# Patient Record
Sex: Male | Born: 1945 | Race: Black or African American | Hispanic: No | Marital: Married | Smoking: Former smoker
Health system: Southern US, Community
[De-identification: ages and names within clinical notes are randomized; demographics above are authoritative.]

## PROBLEM LIST (undated history)

## (undated) DIAGNOSIS — R51 Headache: Secondary | ICD-10-CM

## (undated) DIAGNOSIS — N400 Enlarged prostate without lower urinary tract symptoms: Secondary | ICD-10-CM

## (undated) DIAGNOSIS — I517 Cardiomegaly: Secondary | ICD-10-CM

## (undated) DIAGNOSIS — R011 Cardiac murmur, unspecified: Secondary | ICD-10-CM

## (undated) DIAGNOSIS — N183 Chronic kidney disease, stage 3 unspecified: Secondary | ICD-10-CM

## (undated) DIAGNOSIS — R519 Headache, unspecified: Secondary | ICD-10-CM

## (undated) DIAGNOSIS — K759 Inflammatory liver disease, unspecified: Secondary | ICD-10-CM

## (undated) DIAGNOSIS — K219 Gastro-esophageal reflux disease without esophagitis: Secondary | ICD-10-CM

## (undated) DIAGNOSIS — E7800 Pure hypercholesterolemia, unspecified: Secondary | ICD-10-CM

## (undated) DIAGNOSIS — I10 Essential (primary) hypertension: Secondary | ICD-10-CM

## (undated) DIAGNOSIS — I2510 Atherosclerotic heart disease of native coronary artery without angina pectoris: Secondary | ICD-10-CM

## (undated) DIAGNOSIS — M1990 Unspecified osteoarthritis, unspecified site: Secondary | ICD-10-CM

## (undated) HISTORY — PX: VARICOSE VEIN SURGERY: SHX832

## (undated) HISTORY — PX: TONSILLECTOMY: SUR1361

---

## 2012-06-03 HISTORY — PX: CARDIOVASCULAR STRESS TEST: SHX262

## 2014-09-28 NOTE — H&P (Signed)
UNICOMPARTMENTAL KNEE ADMISSION H&P  Patient is being admitted for right medial unicompartmental knee arthroplasty.  Subjective:  Chief Complaint:  Right knee medial compartmental primary OA /pain    HPI: Garrett Hogan, 69 y.o. male male, has a history of pain and functional disability in the right and has failed non-surgical conservative treatments for greater than 12 weeks to include NSAID's and/or analgesics, corticosteriod injections, viscosupplementation injections, use of assistive devices and activity modification.  Onset of symptoms was gradual, starting 2+ years ago with gradually worsening course since that time. The patient noted no past surgery on the right knee(s).  Patient currently rates pain in the right knee(s) at 8 out of 10 with activity. Patient has night pain, worsening of pain with activity and weight bearing, pain that interferes with activities of daily living, pain with passive range of motion, crepitus and joint swelling.  Patient has evidence of periarticular osteophytes and joint space narrowing of the medial compartment by imaging studies.  There is no active infection.  Risks, benefits and expectations were discussed with the patient.  Risks including but not limited to the risk of anesthesia, blood clots, nerve damage, blood vessel damage, failure of the prosthesis, infection and up to and including death.  Patient understand the risks, benefits and expectations and wishes to proceed with surgery.   PCP: Vonna Kotyk  D/C Plans:      Home with HHPT  Post-op Meds:       No Rx given   Tranexamic Acid:      To be given - IV   Decadron:    It is to be given  FYI:     ASA post-op  Norco post-op    Past Medical History  Diagnosis Date  . Hypertension   . Arthritis   . Hepatitis     hx of Hep C - 1970s      Past Surgical History  Procedure Laterality Date  . Varicose vein surgery      left leg     No Known Allergies  History   Substance Use Topics  . Smoking status: Former Games developer  . Smokeless tobacco: Never Used  . Alcohol Use: Yes     Comment: drinks wine once per month        Review of Systems  Constitutional: Negative.   HENT: Negative.   Eyes: Negative.   Respiratory: Negative.   Cardiovascular: Negative.   Gastrointestinal: Negative.   Genitourinary: Positive for frequency.  Musculoskeletal: Positive for joint pain.  Skin: Negative.   Neurological: Negative.   Endo/Heme/Allergies: Negative.   Psychiatric/Behavioral: Negative.      Objective:   Physical Exam  Constitutional: He is oriented to person, place, and time and well-developed, well-nourished, and in no distress.  HENT:  Head: Normocephalic and atraumatic.  Eyes: Pupils are equal, round, and reactive to light.  Neck: Neck supple. No JVD present. No tracheal deviation present. No thyromegaly present.  Cardiovascular: Normal rate, regular rhythm and intact distal pulses.   Murmur heard. Pulmonary/Chest: Effort normal and breath sounds normal. No stridor. No respiratory distress. He has no wheezes.  Abdominal: Soft. There is no tenderness. There is no guarding.  Musculoskeletal:       Right knee: He exhibits decreased range of motion and bony tenderness. He exhibits no effusion, no ecchymosis, no deformity, no laceration and no erythema. Tenderness found. Medial joint line tenderness noted. No lateral joint line tenderness noted.  Lymphadenopathy:    He has no cervical adenopathy.  Neurological: He is alert and oriented to person, place, and time.  Skin: Skin is warm and dry.  Psychiatric: Affect normal.       Imaging Review Plain radiographs demonstrate severe degenerative joint disease of the right knee(s) medial compartment. The overall alignment is neutral. The bone quality appears to be good for age and reported activity level.  Assessment/Plan:  End stage arthritis, right knee medial compartment  The patient  history, physical examination, clinical judgment of the provider and imaging studies are consistent with end stage degenerative joint disease of the right knee(s) and medial unicompartmental knee arthroplasty is deemed medically necessary. The treatment options including medical management, injection therapy arthroscopy and arthroplasty were discussed at length. The risks and benefits of total knee arthroplasty were presented and reviewed. The risks due to aseptic loosening, infection, stiffness, patella tracking problems, thromboembolic complications and other imponderables were discussed. The patient acknowledged the explanation, agreed to proceed with the plan and consent was signed. Patient is being admitted for outpatient / observation treatment for surgery, pain control, PT, OT, prophylactic antibiotics, VTE prophylaxis, progressive ambulation and ADL's and discharge planning. The patient is planning to be discharged home with home health services.    Anastasio AuerbachMatthew S. Kaelon Weekes   PA-C  10/05/2014, 12:44 PM

## 2014-09-29 NOTE — Patient Instructions (Addendum)
Garrett Hogan  09/29/2014   Your procedure is scheduled on: 10-10-2014   Report to [facility] Main  Entrance and follow signs to               Short Stay Center at    [facility] - Preparing for Surgery Before surgery, you can play an important role.  Because skin is not sterile, your skin needs to be as free of germs as possible.  You can reduce the number of germs on your skin by washing with CHG (chlorahexidine gluconate) soap before surgery.  CHG is an antiseptic cleaner which kills germs and bonds with the skin to continue killing germs even after washing. Please DO NOT use if you have an allergy to CHG or antibacterial soaps.  If your skin becomes reddened/irritated stop using the CHG and inform your nurse when you arrive at Short Stay. Do not shave (including legs and underarms) for at least 48 hours prior to the first CHG shower.  You may shave your face/neck. Please follow these instructions carefully:  1.  Shower with CHG Soap the night before surgery and the  morning of Surgery.  2.  If you choose to wash your hair, wash your hair first as usual with your  normal  shampoo.  3.  After you shampoo, rinse your hair and body thoroughly to remove the  shampoo.                           4.  Use CHG as you would any other liquid soap.  You can apply chg directly  to the skin and wash                       Gently with a scrungie or clean washcloth.  5.  Apply the CHG Soap to your body ONLY FROM THE NECK DOWN.   Do not use on face/ open                           Wound or open sores. Avoid contact with eyes, ears mouth and genitals (private parts).                       Wash face,  Genitals (private parts) with your normal soap.             6.  Wash thoroughly, paying special attention to the area where your surgery  will be performed.  7.  Thoroughly rinse your body with warm water from the neck down.  8.  DO NOT shower/wash with your normal soap after using and  rinsing off  the CHG Soap.                9.  Pat yourself dry with a clean towel.            10.  Wear clean pajamas.            11.  Place clean sheets on your bed the night of your first shower and do not  sleep with pets. Day of Surgery : Do not apply any lotions/deodorants the morning of surgery.  Please wear clean clothes to the hospital/surgery center.  FAILURE TO FOLLOW THESE INSTRUCTIONS MAY RESULT IN THE CANCELLATION OF YOUR SURGERY PATIENT SIGNATURE_________________________________  NURSE SIGNATURE__________________________________  ________________________________________________________________________  AM.  Call this number if you have problems the morning of surgery [phone]   Remember:  Do not eat food or drink liquids :After Midnight.     Take these medicines the morning of surgery with A SIP OF WATER:none                                You may not have any metal on your body including hair pins and              piercings  Do not wear jewelry, lotions, powders or perfumes, deodorant                       Men may shave face and neck.   Do not bring valuables to the hospital. [facility] IS NOT             RESPONSIBLE   FOR VALUABLES.  Contacts, dentures or bridgework may not be worn into surgery.  Leave suitcase in the car. After surgery it may be brought to your room.     Special Instructions: coughing and deep breathing exercises, leg exercises              Please read over the following fact sheets you were given: _____________________________________________________________________              WHAT IS A BLOOD TRANSFUSION? Blood Transfusion Information  A transfusion is the replacement of blood or some of its parts. Blood is made up of multiple cells which provide different functions.  Red blood cells carry oxygen and are used for blood loss replacement.  White blood cells fight against infection.  Platelets control bleeding.  Plasma helps  clot blood.  Other blood products are available for specialized needs, such as hemophilia or other clotting disorders. BEFORE THE TRANSFUSION  Who gives blood for transfusions?   Healthy volunteers who are fully evaluated to make sure their blood is safe. This is blood bank blood. Transfusion therapy is the safest it has ever been in the practice of medicine. Before blood is taken from a donor, a complete history is taken to make sure that person has no history of diseases nor engages in risky social behavior (examples are intravenous drug use or sexual activity with multiple partners). The donor's travel history is screened to minimize risk of transmitting infections, such as malaria. The donated blood is tested for signs of infectious diseases, such as HIV and hepatitis. The blood is then tested to be sure it is compatible with you in order to minimize the chance of a transfusion reaction. If you or a relative donates blood, this is often done in anticipation of surgery and is not appropriate for emergency situations. It takes many days to process the donated blood. RISKS AND COMPLICATIONS Although transfusion therapy is very safe and saves many lives, the main dangers of transfusion include:  1. Getting an infectious disease. 2. Developing a transfusion reaction. This is an allergic reaction to something in the blood you were given. Every precaution is taken to prevent this. The decision to have a blood transfusion has been considered carefully by your caregiver before blood is given. Blood is not given unless the benefits outweigh the risks. AFTER THE TRANSFUSION  Right after receiving a blood transfusion, you will usually feel much better and more energetic. This is especially true if your red blood cells have gotten low (  anemic). The transfusion raises the level of the red blood cells which carry oxygen, and this usually causes an energy increase.  The nurse administering the transfusion will  monitor you carefully for complications. HOME CARE INSTRUCTIONS  No special instructions are needed after a transfusion. You may find your energy is better. Speak with your caregiver about any limitations on activity for underlying diseases you may have. SEEK MEDICAL CARE IF:   Your condition is not improving after your transfusion.  You develop redness or irritation at the intravenous (IV) site. SEEK IMMEDIATE MEDICAL CARE IF:  Any of the following symptoms occur over the next 12 hours:  Shaking chills.  You have a temperature by mouth above 102 F (38.9 C), not controlled by medicine.  Chest, back, or muscle pain.  People around you feel you are not acting correctly or are confused.  Shortness of breath or difficulty breathing.  Dizziness and fainting.  You get a rash or develop hives.  You have a decrease in urine output.  Your urine turns a dark color or changes to pink, red, or brown. Any of the following symptoms occur over the next 10 days:  You have a temperature by mouth above 102 F (38.9 C), not controlled by medicine.  Shortness of breath.  Weakness after normal activity.  The white part of the eye turns yellow (jaundice).  You have a decrease in the amount of urine or are urinating less often.  Your urine turns a dark color or changes to pink, red, or brown. Document Released: 05/17/2000 Document Revised: 08/12/2011 Document Reviewed: 01/04/2008 [employer] Patient Information 2014 [place].  _______________________________________________________________________  Incentive Spirometer  An incentive spirometer is a tool that can help keep your lungs clear and active. This tool measures how well you are filling your lungs with each breath. Taking long deep breaths may help reverse or decrease the chance of developing breathing (pulmonary) problems (especially infection) following:  A long period of time when you are unable to move or be  active. BEFORE THE PROCEDURE   If the spirometer includes an indicator to show your best effort, your nurse or respiratory therapist will set it to a desired goal.  If possible, sit up straight or lean slightly forward. Try not to slouch.  Hold the incentive spirometer in an upright position. INSTRUCTIONS FOR USE  3. Sit on the edge of your bed if possible, or sit up as far as you can in bed or on a chair. 4. Hold the incentive spirometer in an upright position. 5. Breathe out normally. 6. Place the mouthpiece in your mouth and seal your lips tightly around it. 7. Breathe in slowly and as deeply as possible, raising the piston or the ball toward the top of the column. 8. Hold your breath for 3-5 seconds or for as long as possible. Allow the piston or ball to fall to the bottom of the column. 9. Remove the mouthpiece from your mouth and breathe out normally. 10. Rest for a few seconds and repeat Steps 1 through 7 at least 10 times every 1-2 hours when you are awake. Take your time and take a few normal breaths between deep breaths. 11. The spirometer may include an indicator to show your best effort. Use the indicator as a goal to work toward during each repetition. 12. After each set of 10 deep breaths, practice coughing to be sure your lungs are clear. If you have an incision (the cut made at the time of  surgery), support your incision when coughing by placing a pillow or rolled up towels firmly against it. Once you are able to get out of bed, walk around indoors and cough well. You may stop using the incentive spirometer when instructed by your caregiver.  RISKS AND COMPLICATIONS  Take your time so you do not get dizzy or light-headed.  If you are in pain, you may need to take or ask for pain medication before doing incentive spirometry. It is harder to take a deep breath if you are having pain. AFTER USE  Rest and breathe slowly and easily.  It can be helpful to keep track of a log of  your progress. Your caregiver can provide you with a simple table to help with this. If you are using the spirometer at home, follow these instructions: SEEK MEDICAL CARE IF:   You are having difficultly using the spirometer.  You have trouble using the spirometer as often as instructed.  Your pain medication is not giving enough relief while using the spirometer.  You develop fever of 100.5 F (38.1 C) or higher. SEEK IMMEDIATE MEDICAL CARE IF:   You cough up bloody sputum that had not been present before.  You develop fever of 102 F (38.9 C) or greater.  You develop worsening pain at or near the incision site. MAKE SURE YOU:   Understand these instructions.  Will watch your condition.  Will get help right away if you are not doing well or get worse. Document Released: 09/30/2006 Document Revised: 08/12/2011 Document Reviewed: 12/01/2006 [facility] Patient Information 2014 [place], [place].   ________________________________________________________________________

## 2014-10-03 ENCOUNTER — Encounter (HOSPITAL_COMMUNITY)
Admission: RE | Admit: 2014-10-03 | Discharge: 2014-10-03 | Disposition: A | Discharge status: Home / Self Care | Payer: Medicare Other | Source: Ambulatory Visit | Attending: Orthopedic Surgery | Admitting: Orthopedic Surgery

## 2014-10-03 ENCOUNTER — Encounter (HOSPITAL_COMMUNITY): Payer: Self-pay

## 2014-10-03 DIAGNOSIS — Z01812 Encounter for preprocedural laboratory examination: Secondary | ICD-10-CM | POA: Diagnosis not present

## 2014-10-03 DIAGNOSIS — M1711 Unilateral primary osteoarthritis, right knee: Secondary | ICD-10-CM | POA: Diagnosis not present

## 2014-10-03 HISTORY — DX: Inflammatory liver disease, unspecified: K75.9

## 2014-10-03 HISTORY — DX: Essential (primary) hypertension: I10

## 2014-10-03 HISTORY — DX: Unspecified osteoarthritis, unspecified site: M19.90

## 2014-10-03 LAB — URINALYSIS, ROUTINE W REFLEX MICROSCOPIC
BILIRUBIN URINE: NEGATIVE
Glucose, UA: NEGATIVE mg/dL
Hgb urine dipstick: NEGATIVE
Ketones, ur: NEGATIVE mg/dL
LEUKOCYTES UA: NEGATIVE
NITRITE: NEGATIVE
Protein, ur: NEGATIVE mg/dL
SPECIFIC GRAVITY, URINE: 1.029 (ref 1.005–1.030)
UROBILINOGEN UA: 0.2 mg/dL (ref 0.0–1.0)
pH: 6 (ref 5.0–8.0)

## 2014-10-03 LAB — COMPREHENSIVE METABOLIC PANEL
ALT: 25 U/L (ref 17–63)
ANION GAP: 7 (ref 5–15)
AST: 40 U/L (ref 15–41)
Albumin: 4.3 g/dL (ref 3.5–5.0)
Alkaline Phosphatase: 38 U/L (ref 38–126)
BUN: 24 mg/dL — ABNORMAL HIGH (ref 6–20)
CALCIUM: 9.2 mg/dL (ref 8.9–10.3)
CHLORIDE: 108 mmol/L (ref 101–111)
CO2: 28 mmol/L (ref 22–32)
CREATININE: 1.33 mg/dL — AB (ref 0.61–1.24)
GFR calc non Af Amer: 53 mL/min — ABNORMAL LOW (ref >60)
Glucose, Bld: 91 mg/dL (ref 70–99)
Potassium: 4.2 mmol/L (ref 3.5–5.1)
Sodium: 143 mmol/L (ref 135–145)
Total Bilirubin: 0.8 mg/dL (ref 0.3–1.2)
Total Protein: 7.3 g/dL (ref 6.5–8.1)

## 2014-10-03 LAB — CBC
HCT: 42.6 % (ref 39.0–52.0)
Hemoglobin: 13.6 g/dL (ref 13.0–17.0)
MCH: 31.7 pg (ref 26.0–34.0)
MCHC: 31.9 g/dL (ref 30.0–36.0)
MCV: 99.3 fL (ref 78.0–100.0)
Platelets: 225 10*3/uL (ref 150–400)
RBC: 4.29 MIL/uL (ref 4.22–5.81)
RDW: 14.5 % (ref 11.5–15.5)
WBC: 5.9 10*3/uL (ref 4.0–10.5)

## 2014-10-03 LAB — SURGICAL PCR SCREEN
MRSA, PCR: NEGATIVE
Staphylococcus aureus: POSITIVE — AB

## 2014-10-03 LAB — PROTIME-INR
INR: 1.31 (ref 0.00–1.49)
Prothrombin Time: 16.4 seconds — ABNORMAL HIGH (ref 11.6–15.2)

## 2014-10-03 LAB — APTT: aPTT: 44 seconds — ABNORMAL HIGH (ref 24–37)

## 2014-10-03 LAB — ABO/RH: ABO/RH(D): O POS

## 2014-10-03 NOTE — Progress Notes (Signed)
PTT results done 10/03/2014 faxed via EPIC to Dr Charlann Boxerlin.

## 2014-10-03 NOTE — Progress Notes (Addendum)
Reqeuested EKG with date of birth and date of service for the third time along with ECHO done 09/2014.   LOV note- Dr Hanley Haysosario 09/02/2014 along with clearance 09/02/2014 on chart  Stress Test done 2014 on chart \\EKG  done 09/02/2014 on chart along with ECHO done 09/02/2014 on chart

## 2014-10-03 NOTE — Progress Notes (Signed)
CMP results done 10/03/14 faxed via EPIC to Dr Charlann Boxerlin.

## 2014-10-10 ENCOUNTER — Ambulatory Visit (HOSPITAL_COMMUNITY): Payer: Medicare Other | Admitting: Registered Nurse

## 2014-10-10 ENCOUNTER — Observation Stay (HOSPITAL_COMMUNITY)
Admission: RE | Admit: 2014-10-10 | Discharge: 2014-10-11 | Disposition: A | Discharge status: Home / Self Care | Payer: Medicare Other | Source: Ambulatory Visit | Attending: Orthopedic Surgery | Admitting: Orthopedic Surgery

## 2014-10-10 ENCOUNTER — Encounter (HOSPITAL_COMMUNITY)
Admission: RE | Disposition: A | Discharge status: Home / Self Care | Payer: Self-pay | Source: Ambulatory Visit | Attending: Orthopedic Surgery

## 2014-10-10 ENCOUNTER — Encounter (HOSPITAL_COMMUNITY): Payer: Self-pay | Admitting: *Deleted

## 2014-10-10 DIAGNOSIS — M1711 Unilateral primary osteoarthritis, right knee: Secondary | ICD-10-CM | POA: Diagnosis present

## 2014-10-10 DIAGNOSIS — B1920 Unspecified viral hepatitis C without hepatic coma: Secondary | ICD-10-CM | POA: Insufficient documentation

## 2014-10-10 DIAGNOSIS — Z96651 Presence of right artificial knee joint: Secondary | ICD-10-CM

## 2014-10-10 DIAGNOSIS — I10 Essential (primary) hypertension: Secondary | ICD-10-CM | POA: Insufficient documentation

## 2014-10-10 DIAGNOSIS — Z96659 Presence of unspecified artificial knee joint: Secondary | ICD-10-CM

## 2014-10-10 DIAGNOSIS — Z87891 Personal history of nicotine dependence: Secondary | ICD-10-CM | POA: Diagnosis not present

## 2014-10-10 HISTORY — PX: PARTIAL KNEE ARTHROPLASTY: SHX2174

## 2014-10-10 LAB — TYPE AND SCREEN
ABO/RH(D): O POS
Antibody Screen: NEGATIVE

## 2014-10-10 SURGERY — ARTHROPLASTY, KNEE, UNICOMPARTMENTAL
Anesthesia: Spinal | Site: Knee | Laterality: Right

## 2014-10-10 MED ORDER — OXYCODONE HCL 5 MG/5ML PO SOLN
5.0000 mg | Freq: Once | ORAL | Status: DC | PRN
Start: 2014-10-10 — End: 2014-10-10

## 2014-10-10 MED ORDER — CHLORHEXIDINE GLUCONATE 4 % EX LIQD: 60.0000 mL | Freq: Once | CUTANEOUS | Status: DC

## 2014-10-10 MED ORDER — CEFAZOLIN SODIUM-DEXTROSE 2-3 GM-% IV SOLR
INTRAVENOUS | Status: AC
  Filled 2014-10-10: qty 50

## 2014-10-10 MED ORDER — PROPOFOL 10 MG/ML IV BOLUS
INTRAVENOUS | Status: AC
  Filled 2014-10-10: qty 20

## 2014-10-10 MED ORDER — KETOROLAC TROMETHAMINE 30 MG/ML IJ SOLN
INTRAMUSCULAR | Status: DC | PRN
  Administered 2014-10-10: 30 mg

## 2014-10-10 MED ORDER — ONDANSETRON HCL 4 MG PO TABS: 4.0000 mg | ORAL_TABLET | Freq: Four times a day (QID) | ORAL | Status: DC | PRN

## 2014-10-10 MED ORDER — PHENOL 1.4 % MT LIQD: 1.0000 | OROMUCOSAL | Status: DC | PRN

## 2014-10-10 MED ORDER — FENTANYL CITRATE (PF) 100 MCG/2ML IJ SOLN
INTRAMUSCULAR | Status: DC | PRN
  Administered 2014-10-10: 100 ug via INTRAVENOUS

## 2014-10-10 MED ORDER — MENTHOL 3 MG MT LOZG: 1.0000 | LOZENGE | OROMUCOSAL | Status: DC | PRN

## 2014-10-10 MED ORDER — SODIUM CHLORIDE 0.9 % IJ SOLN
INTRAMUSCULAR | Status: DC | PRN
  Administered 2014-10-10: 29 mL

## 2014-10-10 MED ORDER — SODIUM CHLORIDE 0.9 % IV SOLN
INTRAVENOUS | Status: DC
Start: 2014-10-10 — End: 2014-10-11
  Administered 2014-10-10: 11:00:00 via INTRAVENOUS
  Filled 2014-10-10 (×9): qty 1000

## 2014-10-10 MED ORDER — PROPOFOL 10 MG/ML IV BOLUS
INTRAVENOUS | Status: DC | PRN
  Administered 2014-10-10: 30 mg via INTRAVENOUS

## 2014-10-10 MED ORDER — SODIUM CHLORIDE 0.9 % IJ SOLN
INTRAMUSCULAR | Status: AC
  Filled 2014-10-10: qty 50

## 2014-10-10 MED ORDER — DILTIAZEM HCL ER 240 MG PO CP24
240.0000 mg | ORAL_CAPSULE | Freq: Every day | ORAL | Status: DC
  Administered 2014-10-10: 240 mg via ORAL
  Filled 2014-10-10 (×2): qty 1

## 2014-10-10 MED ORDER — KETOROLAC TROMETHAMINE 30 MG/ML IJ SOLN
30.0000 mg | Freq: Once | INTRAMUSCULAR | Status: AC | PRN
  Administered 2014-10-10: 30 mg via INTRAVENOUS

## 2014-10-10 MED ORDER — BUPIVACAINE-EPINEPHRINE (PF) 0.25% -1:200000 IJ SOLN
INTRAMUSCULAR | Status: AC
  Filled 2014-10-10: qty 30

## 2014-10-10 MED ORDER — LACTATED RINGERS IV SOLN
INTRAVENOUS | Status: DC | PRN
  Administered 2014-10-10 (×3): via INTRAVENOUS

## 2014-10-10 MED ORDER — METHOCARBAMOL 500 MG PO TABS
500.0000 mg | ORAL_TABLET | Freq: Four times a day (QID) | ORAL | Status: DC | PRN
  Administered 2014-10-10 – 2014-10-11 (×2): 500 mg via ORAL
  Filled 2014-10-10 (×2): qty 1

## 2014-10-10 MED ORDER — CELECOXIB 200 MG PO CAPS
200.0000 mg | ORAL_CAPSULE | Freq: Two times a day (BID) | ORAL | Status: DC
  Administered 2014-10-10: 200 mg via ORAL
  Filled 2014-10-10 (×3): qty 1

## 2014-10-10 MED ORDER — ONDANSETRON HCL 4 MG/2ML IJ SOLN: 4.0000 mg | Freq: Four times a day (QID) | INTRAMUSCULAR | Status: DC | PRN

## 2014-10-10 MED ORDER — KETOROLAC TROMETHAMINE 30 MG/ML IJ SOLN
INTRAMUSCULAR | Status: AC
  Filled 2014-10-10: qty 1

## 2014-10-10 MED ORDER — DOCUSATE SODIUM 100 MG PO CAPS
100.0000 mg | ORAL_CAPSULE | Freq: Two times a day (BID) | ORAL | Status: DC
Start: 2014-10-10 — End: 2014-10-11
  Administered 2014-10-10 – 2014-10-11 (×2): 100 mg via ORAL

## 2014-10-10 MED ORDER — HYDROMORPHONE HCL 1 MG/ML IJ SOLN
INTRAMUSCULAR | Status: AC
  Filled 2014-10-10: qty 2

## 2014-10-10 MED ORDER — BISACODYL 10 MG RE SUPP: 10.0000 mg | Freq: Every day | RECTAL | Status: DC | PRN

## 2014-10-10 MED ORDER — PROPOFOL INFUSION 10 MG/ML OPTIME
INTRAVENOUS | Status: DC | PRN
  Administered 2014-10-10: 50 ug/kg/min via INTRAVENOUS

## 2014-10-10 MED ORDER — HYDROCODONE-ACETAMINOPHEN 7.5-325 MG PO TABS
1.0000 | ORAL_TABLET | ORAL | Status: DC
  Administered 2014-10-10: 2 via ORAL
  Administered 2014-10-10 – 2014-10-11 (×3): 1 via ORAL
  Filled 2014-10-10: qty 1
  Filled 2014-10-10: qty 2
  Filled 2014-10-10 (×2): qty 1
  Filled 2014-10-10: qty 2

## 2014-10-10 MED ORDER — BUPIVACAINE-EPINEPHRINE 0.25% -1:200000 IJ SOLN
INTRAMUSCULAR | Status: DC | PRN
  Administered 2014-10-10: 30 mL

## 2014-10-10 MED ORDER — METOCLOPRAMIDE HCL 5 MG PO TABS
5.0000 mg | ORAL_TABLET | Freq: Three times a day (TID) | ORAL | Status: DC | PRN
  Filled 2014-10-10: qty 2

## 2014-10-10 MED ORDER — HYDROMORPHONE HCL 1 MG/ML IJ SOLN
INTRAMUSCULAR | Status: AC
  Filled 2014-10-10: qty 1

## 2014-10-10 MED ORDER — ASPIRIN EC 325 MG PO TBEC
325.0000 mg | DELAYED_RELEASE_TABLET | Freq: Two times a day (BID) | ORAL | Status: DC
  Administered 2014-10-11: 325 mg via ORAL
  Filled 2014-10-10 (×3): qty 1

## 2014-10-10 MED ORDER — DEXTROSE 5 % IV SOLN
500.0000 mg | Freq: Four times a day (QID) | INTRAVENOUS | Status: DC | PRN
  Administered 2014-10-10: 500 mg via INTRAVENOUS
  Filled 2014-10-10 (×2): qty 5

## 2014-10-10 MED ORDER — FENTANYL CITRATE (PF) 100 MCG/2ML IJ SOLN
INTRAMUSCULAR | Status: AC
  Filled 2014-10-10: qty 2

## 2014-10-10 MED ORDER — METOCLOPRAMIDE HCL 5 MG/ML IJ SOLN: 5.0000 mg | Freq: Three times a day (TID) | INTRAMUSCULAR | Status: DC | PRN

## 2014-10-10 MED ORDER — HYDROMORPHONE HCL 1 MG/ML IJ SOLN
0.2500 mg | INTRAMUSCULAR | Status: DC | PRN
  Administered 2014-10-10 (×4): 0.5 mg via INTRAVENOUS

## 2014-10-10 MED ORDER — HYDROMORPHONE HCL 1 MG/ML IJ SOLN
INTRAMUSCULAR | Status: AC
  Administered 2014-10-10: 1 mg via INTRAVENOUS
  Filled 2014-10-10: qty 1

## 2014-10-10 MED ORDER — KETOROLAC TROMETHAMINE 30 MG/ML IJ SOLN
INTRAMUSCULAR | Status: AC
Start: 2014-10-10 — End: 2014-10-10
  Filled 2014-10-10: qty 1

## 2014-10-10 MED ORDER — HYDROMORPHONE HCL 1 MG/ML IJ SOLN
0.5000 mg | INTRAMUSCULAR | Status: DC | PRN
  Administered 2014-10-10: 1 mg via INTRAVENOUS
  Filled 2014-10-10: qty 1

## 2014-10-10 MED ORDER — MEPERIDINE HCL 50 MG/ML IJ SOLN: 6.2500 mg | INTRAMUSCULAR | Status: DC | PRN

## 2014-10-10 MED ORDER — MAGNESIUM CITRATE PO SOLN: 1.0000 | Freq: Once | ORAL | Status: AC | PRN

## 2014-10-10 MED ORDER — FERROUS SULFATE 325 (65 FE) MG PO TABS
325.0000 mg | ORAL_TABLET | Freq: Three times a day (TID) | ORAL | Status: DC
  Filled 2014-10-10 (×6): qty 1

## 2014-10-10 MED ORDER — 0.9 % SODIUM CHLORIDE (POUR BTL) OPTIME
TOPICAL | Status: DC | PRN
  Administered 2014-10-10: 1000 mL

## 2014-10-10 MED ORDER — DIPHENHYDRAMINE HCL 25 MG PO CAPS: 25.0000 mg | ORAL_CAPSULE | Freq: Four times a day (QID) | ORAL | Status: DC | PRN

## 2014-10-10 MED ORDER — OXYCODONE HCL 5 MG PO TABS: 5.0000 mg | ORAL_TABLET | Freq: Once | ORAL | Status: DC | PRN

## 2014-10-10 MED ORDER — MIDAZOLAM HCL 2 MG/2ML IJ SOLN
INTRAMUSCULAR | Status: AC
  Filled 2014-10-10: qty 2

## 2014-10-10 MED ORDER — PROMETHAZINE HCL 25 MG/ML IJ SOLN
INTRAMUSCULAR | Status: AC
Start: 2014-10-10 — End: 2014-10-10
  Filled 2014-10-10: qty 1

## 2014-10-10 MED ORDER — POLYETHYLENE GLYCOL 3350 17 G PO PACK
17.0000 g | PACK | Freq: Two times a day (BID) | ORAL | Status: DC
  Administered 2014-10-11: 17 g via ORAL

## 2014-10-10 MED ORDER — CEFAZOLIN SODIUM-DEXTROSE 2-3 GM-% IV SOLR
2.0000 g | Freq: Four times a day (QID) | INTRAVENOUS | Status: AC
  Administered 2014-10-10 (×2): 2 g via INTRAVENOUS
  Filled 2014-10-10 (×2): qty 50

## 2014-10-10 MED ORDER — CEFAZOLIN SODIUM-DEXTROSE 2-3 GM-% IV SOLR
2.0000 g | INTRAVENOUS | Status: AC
  Administered 2014-10-10: 2 g via INTRAVENOUS

## 2014-10-10 MED ORDER — SODIUM CHLORIDE 0.9 % IV SOLN
1000.0000 mg | Freq: Once | INTRAVENOUS | Status: AC
  Administered 2014-10-10: 1000 mg via INTRAVENOUS
  Filled 2014-10-10: qty 10

## 2014-10-10 MED ORDER — DEXAMETHASONE SODIUM PHOSPHATE 10 MG/ML IJ SOLN
10.0000 mg | Freq: Once | INTRAMUSCULAR | Status: AC
  Administered 2014-10-10: 10 mg via INTRAVENOUS

## 2014-10-10 MED ORDER — MIDAZOLAM HCL 5 MG/5ML IJ SOLN
INTRAMUSCULAR | Status: DC | PRN
  Administered 2014-10-10: 2 mg via INTRAVENOUS

## 2014-10-10 MED ORDER — PROMETHAZINE HCL 25 MG/ML IJ SOLN
6.2500 mg | INTRAMUSCULAR | Status: DC | PRN
  Administered 2014-10-10: 12.5 mg via INTRAVENOUS

## 2014-10-10 MED ORDER — ALUM & MAG HYDROXIDE-SIMETH 200-200-20 MG/5ML PO SUSP: 30.0000 mL | ORAL | Status: DC | PRN

## 2014-10-10 MED ORDER — DEXAMETHASONE SODIUM PHOSPHATE 10 MG/ML IJ SOLN
10.0000 mg | Freq: Once | INTRAMUSCULAR | Status: AC
  Administered 2014-10-11: 10 mg via INTRAVENOUS
  Filled 2014-10-10: qty 1

## 2014-10-10 SURGICAL SUPPLY — 53 items
BAG ZIPLOCK 12X15 (MISCELLANEOUS) IMPLANT
BANDAGE ELASTIC 6 VELCRO ST LF (GAUZE/BANDAGES/DRESSINGS) ×2 IMPLANT
BANDAGE ESMARK 6X9 LF (GAUZE/BANDAGES/DRESSINGS) ×1 IMPLANT
BLADE SAW RECIPROCATING 77.5 (BLADE) ×2 IMPLANT
BLADE SAW SGTL 13.0X1.19X90.0M (BLADE) ×2 IMPLANT
BNDG ESMARK 6X9 LF (GAUZE/BANDAGES/DRESSINGS) ×2
BOWL SMART MIX CTS (DISPOSABLE) ×2 IMPLANT
CAPT KNEE PARTIAL 2 ×2 IMPLANT
CEMENT HV SMART SET (Cement) ×2 IMPLANT
CUFF TOURN SGL QUICK 34 (TOURNIQUET CUFF) ×1
CUFF TRNQT CYL 34X4X40X1 (TOURNIQUET CUFF) ×1 IMPLANT
DRAPE EXTREMITY T 121X128X90 (DRAPE) ×2 IMPLANT
DRAPE POUCH INSTRU U-SHP 10X18 (DRAPES) ×2 IMPLANT
DRAPE U-SHAPE 47X51 STRL (DRAPES) ×2 IMPLANT
DRSG AQUACEL AG ADV 3.5X10 (GAUZE/BANDAGES/DRESSINGS) ×2 IMPLANT
DURAPREP 26ML APPLICATOR (WOUND CARE) ×4 IMPLANT
ELECT REM PT RETURN 9FT ADLT (ELECTROSURGICAL) ×2
ELECTRODE REM PT RTRN 9FT ADLT (ELECTROSURGICAL) ×1 IMPLANT
FACESHIELD WRAPAROUND (MASK) ×8 IMPLANT
GLOVE BIOGEL PI IND STRL 6.5 (GLOVE) ×2 IMPLANT
GLOVE BIOGEL PI IND STRL 7.5 (GLOVE) ×2 IMPLANT
GLOVE BIOGEL PI IND STRL 8.5 (GLOVE) ×1 IMPLANT
GLOVE BIOGEL PI INDICATOR 6.5 (GLOVE) ×2
GLOVE BIOGEL PI INDICATOR 7.5 (GLOVE) ×2
GLOVE BIOGEL PI INDICATOR 8.5 (GLOVE) ×1
GLOVE ECLIPSE 8.0 STRL XLNG CF (GLOVE) ×4 IMPLANT
GLOVE ORTHO TXT STRL SZ7.5 (GLOVE) ×4 IMPLANT
GLOVE SURG SS PI 6.5 STRL IVOR (GLOVE) ×2 IMPLANT
GLOVE SURG SS PI 7.5 STRL IVOR (GLOVE) ×2 IMPLANT
GOWN BRE IMP PREV XXLGXLNG (GOWN DISPOSABLE) ×2 IMPLANT
GOWN SPEC L3 XXLG W/TWL (GOWN DISPOSABLE) ×2 IMPLANT
GOWN STRL REUS W/TWL LRG LVL3 (GOWN DISPOSABLE) ×2 IMPLANT
GOWN STRL REUS W/TWL XL LVL3 (GOWN DISPOSABLE) ×2 IMPLANT
KIT BASIN OR (CUSTOM PROCEDURE TRAY) ×2 IMPLANT
LEGGING LITHOTOMY PAIR STRL (DRAPES) ×2 IMPLANT
LIQUID BAND (GAUZE/BANDAGES/DRESSINGS) ×2 IMPLANT
MANIFOLD NEPTUNE II (INSTRUMENTS) ×2 IMPLANT
NDL SAFETY ECLIPSE 18X1.5 (NEEDLE) ×1 IMPLANT
NEEDLE HYPO 18GX1.5 SHARP (NEEDLE) ×1
PACK TOTAL JOINT (CUSTOM PROCEDURE TRAY) ×2 IMPLANT
PEN SKIN MARKING BROAD (MISCELLANEOUS) ×2 IMPLANT
SUCTION FRAZIER 12FR DISP (SUCTIONS) ×2 IMPLANT
SUT MNCRL AB 4-0 PS2 18 (SUTURE) ×2 IMPLANT
SUT VIC AB 1 CT1 36 (SUTURE) ×2 IMPLANT
SUT VIC AB 2-0 CT1 27 (SUTURE) ×2
SUT VIC AB 2-0 CT1 TAPERPNT 27 (SUTURE) ×2 IMPLANT
SUT VLOC 180 0 24IN GS25 (SUTURE) ×2 IMPLANT
SYR 50ML LL SCALE MARK (SYRINGE) ×2 IMPLANT
TOWEL OR 17X26 10 PK STRL BLUE (TOWEL DISPOSABLE) ×2 IMPLANT
TOWEL OR NON WOVEN STRL DISP B (DISPOSABLE) IMPLANT
TRAY FOLEY W/METER SILVER 16FR (SET/KITS/TRAYS/PACK) ×2 IMPLANT
WRAP KNEE MAXI GEL POST OP (GAUZE/BANDAGES/DRESSINGS) ×2 IMPLANT
YANKAUER SUCT BULB TIP NO VENT (SUCTIONS) ×2 IMPLANT

## 2014-10-10 NOTE — Evaluation (Signed)
Physical Therapy Evaluation Patient Details Name: Garrett Hogan MRN: [id number] DOB: [birth year] Today's Date: 10/10/2014   History of Present Illness  R UKA  Clinical Impression  Patient  Tolerated well.  Patient will benefit from PT to address problems listed in note below.    Follow Up Recommendations Home health PT;Supervision/Assistance - 24 hour    Equipment Recommendations  Crutches    Recommendations for Other Services       Precautions / Restrictions Precautions Precautions: Fall;Knee      Mobility  Bed Mobility Overal bed mobility: Needs Assistance Bed Mobility: Supine to Sit     Supine to sit: Min assist     General bed mobility comments: support R knee  Transfers Overall transfer level: Needs assistance Equipment used: Rolling walker (2 wheeled) Transfers: Sit to/from Stand Sit to Stand: Min assist         General transfer comment: cues for safety  Ambulation/Gait Ambulation/Gait assistance: Min assist Ambulation Distance (Feet): 100 Feet Assistive device: Rolling walker (2 wheeled) Gait Pattern/deviations: Step-to pattern;Step-through pattern;Antalgic     General Gait Details: cues for sequence  Stairs            Wheelchair Mobility    Modified Rankin (Stroke Patients Only)       Balance                                             Pertinent Vitals/Pain Pain Assessment: 0-10 Pain Score: 5  Pain Descriptors / Indicators: Aching Pain Intervention(s): Limited activity within patient's tolerance;Monitored during session;Premedicated before session;Ice applied    Home Living Family/patient expects to be discharged to:: Private residence Living Arrangements: Spouse/significant other Available Help at Discharge: Family Type of Home: House Home Access: Stairs to enter Entrance Stairs-Rails: None Entrance Stairs-Number of Steps: 3 Home Layout: Two level Home Equipment: Environmental consultantWalker - 2 wheels      Prior  Function Level of Independence: Independent               Hand Dominance        Extremity/Trunk Assessment               Lower Extremity Assessment: RLE deficits/detail RLE Deficits / Details: able to SLR       Communication   Communication: No difficulties  Cognition Arousal/Alertness: Awake/alert Behavior During Therapy: WFL for tasks assessed/performed Overall Cognitive Status: Within Functional Limits for tasks assessed                      General Comments      Exercises        Assessment/Plan    PT Assessment Patient needs continued PT services  PT Diagnosis Difficulty walking;Acute pain   PT Problem List Decreased strength;Decreased range of motion;Decreased activity tolerance;Decreased knowledge of precautions;Decreased safety awareness;Decreased knowledge of use of DME  PT Treatment Interventions DME instruction;Gait training;Stair training;Functional mobility training;Therapeutic activities;Therapeutic exercise;Patient/family education   PT Goals (Current goals can be found in the Care Plan section) Acute Rehab PT Goals Patient Stated Goal: to go homw PT Goal Formulation: With patient/family Time For Goal Achievement: 10/14/14 Potential to Achieve Goals: Good    Frequency 7X/week   Barriers to discharge        Co-evaluation               End of Session   Activity  Tolerance: Patient tolerated treatment well Patient left: in chair;with family/visitor present Nurse Communication: Mobility status    Functional Assessment Tool Used: clinical judgement Functional Limitation: Mobility: Walking and moving around Mobility: Walking and Moving Around Current Status ([id number]): At least 20 percent but less than 40 percent impaired, limited or restricted Mobility: Walking and Moving Around Goal Status [phone](G8979): At least 1 percent but less than 20 percent impaired, limited or restricted    Time: [id number] PT Time Calculation (min)  (ACUTE ONLY): 21 min   Charges:   PT Evaluation $Initial PT Evaluation Tier I: 1 Procedure     PT G Codes:   PT G-Codes **NOT FOR INPATIENT CLASS** Functional Assessment Tool Used: clinical judgement Functional Limitation: Mobility: Walking and moving around Mobility: Walking and Moving Around Current Status (N8295(G8978): At least 20 percent but less than 40 percent impaired, limited or restricted Mobility: Walking and Moving Around Goal Status [phone](G8979): At least 1 percent but less than 20 percent impaired, limited or restricted    Rada HayHill, Garrett Hogan 10/10/2014, 5:13 PM Blanchard KelchKaren Elianna Hogan PT [phone]

## 2014-10-10 NOTE — Anesthesia Procedure Notes (Signed)
Spinal Patient location during procedure: OR Staffing Performed by: anesthesiologist  Preanesthetic Checklist Completed: patient identified, site marked, surgical consent, pre-op evaluation, timeout performed, IV checked, risks and benefits discussed and monitors and equipment checked Spinal Block Patient position: sitting Prep: Betadine Patient monitoring: heart rate, continuous pulse ox and blood pressure Location: L3-4 Injection technique: single-shot Needle Needle type: Spinocan  Needle gauge: 22 G Needle length: 9 cm Assessment Sensory level: T6 Additional Notes Expiration date of kit checked and confirmed. Patient tolerated procedure well, without complications.  CSF x3 negative heme or paresthesia

## 2014-10-10 NOTE — Op Note (Signed)
NAME: Garrett Hogan    MEDICAL RECORD NO.: [id number]   [facility]ACILITY[facility]   DATE OF BIRTH: [birth year]  PHYSICIAN: Madlyn FrankelMatthew D. Charlann Boxerlin, M.D.    DATE OF PROCEDURE: 10/10/2014    OPERATIVE REPORT   PREOPERATIVE DIAGNOSIS: Right knee medial compartment osteoarthritis.   POSTOPERATIVE DIAGNOSIS: Right knee medial compartment osteoarthritis.  PROCEDURE: Right partial knee replacement utilizing Biomet Oxford knee  component, size Medium femur, a right medial size C tibial tray with a size 4 mm insert.   SURGEON: Madlyn FrankelMatthew D. Charlann Boxerlin, M.D.   ASSISTANT: Lanney GinsMatthew Babish, PAC.  Please note that Mr. Carmon SailsBabish was present for the entirety of the case,  utilized for preoperative positioning, perioperative retractor  management, general facilitation of the case and primary wound closure.   ANESTHESIA: Spinal.   SPECIMENS: None.   COMPLICATIONS: None.  DRAINS: None   TOURNIQUET TIME: 37 minutes at 250 mmHg.   INDICATIONS FOR PROCEDURE: The patient is a 69 y.o. patient of mine who presented for evaluation of right knee pain.  They presented with primary complaints of pain on the medial side of their knee. Radiographs revealed advanced medial compartment arthritis with specifically an antero-medial wear pattern.  There was bone on bone changes noted with subchondral sclerosis and osteophytes present. The patient has had progressive problems failing to respond to conservative measures of medications, injections and activity modification. Risks of infection, DVT, component failure, need for future revision surgery were all discussed and reviewed.  Consent was obtained for benefit of pain relief.   PROCEDURE IN DETAIL: The patient was brought to the operative theater.  Once adequate anesthesia, preoperative antibiotics, 2gm of Ancef, 1gm of Tranexamic Acid, and 10mg  of Decadron administered, the patient was positioned in supine position with a right thigh tourniquet  placed. The right lower extremity was  prepped and draped in sterile  fashion with the leg on the Oxford leg holder.  The leg was allowed to flex to 120 degrees. A time-out  was performed identifying the patient, planned procedure, and extremity.  The leg was exsanguinated, tourniquet elevated to 250 mmHg. A midline  incision was made from the proximal pole of the patella to the tibial tubercle. A  soft tissue plane was created and partial median arthrotomy was then  made to allow for subluxation of the patella. Following initial synovectomy and  debridement, the osteophytes were removed off the medial aspect of the  knee.   Attention was first directed to the tibia. The tibial  extramedullary guide was positioned over the anterior crest of the tibia  and pinned into position, and using a measured resection guide from the  Oxford system, a 4 mm resection was made off the proximal tibia. First  the reciprocating saw along the medial aspect of the tibial spines, then the oscillating saw.    At this point, I sized this cut surface seem to be best fit for a size C tibial tray.  With the retractors out of the wound and the knee held at 90 degrees the 4 feeler gauge had appropriate tension on the medial ligament.   At this point, the femoral canal was opened with a drill and the  intramedullary rod passed. Then using the guide for the medium posterior resection off  the posterior aspect of the femur was positioned over the mid portion of the medial femoral condyle.  The orientation was set using the guide that mates the femoral guide to the intramedullary rod.  The 2 drill holes were made  into the distal femur.  The posterior guide was then impacted into place and the posterior  femoral cut made.  At this point, I milled the distal femur with a size 4 spigot in place. At this point, we did a trial reduction of the Medium femur, size C tibial tray and a 4 feeler gauge. At 90 degrees of  flexion and at 20 degrees of flexion the knee had  symmetric tension on  the ligaments.   Given these findings, the trial femoral component was removed. Final preparation of tibia was carried out by pinning it in position. Then  using a reciprocating saw I removed bone for the keel. Further bone was  removed with an osteotome.  Trial reduction was now carried out with the Medium femur, the keeled C tibia, and the size 4 lollipop insert. The balance of the  ligaments appeared to be symmetric at 20 degrees and 90 degrees. Given  all these findings, the trial components were removed.   Cement was mixed. The final components were opened. The knee was irrigated with  normal saline solution. Then final debridements of the  soft tissue was carried out, I also drilled the sclerotic bone with a drill.  The final components were cemented with a single batch of cement in a  two-stage technique with the tibial component cemented first. The knee  was then brought  to 45 degrees of flexion with a 4 feeler gauge, held with pressure for a minute and half.  After this the femoral component was cemented in place.  The knee was again held at 45 degrees of flexion while the cement fully cured.  Excess cement was removed throughout the knee. Tourniquet was let down  after 37 minutes. After the cement had fully cured and excessive cement  was removed throughout the knee there was no visualized cement present.   The final size 4 Right medial insert was chosen and snapped into position. We re-irrigated  the knee. The extensor mechanism  was then reapproximated using a #1 Vicryl and #0 V-lock sutures with the knee in flexion. The  remaining wound was closed with 2-0 Vicryl and a running 4-0 Monocryl.  The knee was cleaned, dried, and dressed sterilely using Dermabond and  Aquacel dressing. The patient  was brought to the recovery room, Ace wrap in place, tolerating the  procedure well. He will be in the hospital for overnight observation.  We will initiate  physical therapy and progress to ambulate.     Madlyn FrankelMatthew D. Charlann Boxerlin, M.D.

## 2014-10-10 NOTE — Anesthesia Postprocedure Evaluation (Signed)
Anesthesia Post Note  Patient: Garrett Hogan  Procedure(s) Performed: Procedure(s) (LRB): RIGHT UNI-KNEE ARTHROPLASTY MEDIALLY (Right)  Anesthesia type: Spinal  Patient location: PACU  Post pain: Pain level controlled  Post assessment: Post-op Vital signs reviewed  Last Vitals: BP 169/72 mmHg  Pulse 72  Temp(Src) 36.9 C (Oral)  Resp 16  Ht 6\' 3"  (1.905 m)  Wt 243 lb (110.224 kg)  BMI 30.37 kg/m2  SpO2 100%  Post vital signs: Reviewed  Level of consciousness: sedated  Complications: No apparent anesthesia complications

## 2014-10-10 NOTE — Anesthesia Preprocedure Evaluation (Signed)
Anesthesia Evaluation  Patient identified by MRN, date of birth, ID band Patient awake    Reviewed: Allergy & Precautions, NPO status , Patient's Chart, lab work & pertinent test results  Airway Mallampati: II  TM Distance: >3 FB Neck ROM: Full    Dental no notable dental hx.    Pulmonary former smoker,  breath sounds clear to auscultation  Pulmonary exam normal       Cardiovascular hypertension, Normal cardiovascular examRhythm:Regular Rate:Normal     Neuro/Psych negative neurological ROS  negative psych ROS   GI/Hepatic negative GI ROS, (+) Hepatitis -, C  Endo/Other  negative endocrine ROS  Renal/GU negative Renal ROS     Musculoskeletal negative musculoskeletal ROS (+) Arthritis -, Osteoarthritis,    Abdominal   Peds  Hematology negative hematology ROS (+)   Anesthesia Other Findings   Reproductive/Obstetrics negative OB ROS                             Anesthesia Physical Anesthesia Plan  ASA: II  Anesthesia Plan: Spinal   Post-op Pain Management:    Induction:   Airway Management Planned:   Additional Equipment:   Intra-op Plan:   Post-operative Plan:   Informed Consent: I have reviewed the patients History and Physical, chart, labs and discussed the procedure including the risks, benefits and alternatives for the proposed anesthesia with the patient or authorized representative who has indicated his/her understanding and acceptance.   Dental advisory given  Plan Discussed with: CRNA  Anesthesia Plan Comments:         Anesthesia Quick Evaluation

## 2014-10-10 NOTE — Interval H&P Note (Signed)
History and Physical Interval Note:  10/10/2014 6:58 AM  Garrett Hogan  has presented today for surgery, with the diagnosis of RIGHT KNEE OA MEDIALLY  The various methods of treatment have been discussed with the patient and family. After consideration of risks, benefits and other options for treatment, the patient has consented to  Procedure(s): RIGHT UNI-KNEE ARTHROPLASTY MEDIALLY (Right) as a surgical intervention .  The patient's history has been reviewed, patient examined, no change in status, stable for surgery.  I have reviewed the patient's chart and labs.  Questions were answered to the patient's satisfaction.     [name],Lenn Volker D

## 2014-10-10 NOTE — Discharge Instructions (Signed)

## 2014-10-10 NOTE — Transfer of Care (Signed)
Immediate Anesthesia Transfer of Care Note  Patient: Garrett Hogan  Procedure(s) Performed: Procedure(s): RIGHT UNI-KNEE ARTHROPLASTY MEDIALLY (Right)  Patient Location: PACU  Anesthesia Type:Spinal  Level of Consciousness: awake, alert , oriented and patient cooperative  Airway & Oxygen Therapy: Patient Spontanous Breathing and Patient connected to face mask oxygen  Post-op Assessment: Report given to RN and Post -op Vital signs reviewed and stable  Post vital signs: stable  Last Vitals:  Filed Vitals:   10/10/14 0520  BP: 158/80  Pulse: 87  Temp: 37.1 C  Resp: 18    Complications: No apparent anesthesia complications   t12 spinal level

## 2014-10-11 DIAGNOSIS — M1711 Unilateral primary osteoarthritis, right knee: Secondary | ICD-10-CM | POA: Diagnosis not present

## 2014-10-11 LAB — BASIC METABOLIC PANEL
Anion gap: 8 (ref 5–15)
BUN: 23 mg/dL — AB (ref 6–20)
CALCIUM: 8.7 mg/dL — AB (ref 8.9–10.3)
CO2: 24 mmol/L (ref 22–32)
Chloride: 106 mmol/L (ref 101–111)
Creatinine, Ser: 1.19 mg/dL (ref 0.61–1.24)
GFR calc Af Amer: >60 mL/min (ref >60)
Glucose, Bld: 140 mg/dL — ABNORMAL HIGH (ref 70–99)
Potassium: 4.3 mmol/L (ref 3.5–5.1)
Sodium: 138 mmol/L (ref 135–145)

## 2014-10-11 LAB — CBC
HEMATOCRIT: 37.7 % — AB (ref 39.0–52.0)
Hemoglobin: 12.2 g/dL — ABNORMAL LOW (ref 13.0–17.0)
MCH: 31.4 pg (ref 26.0–34.0)
MCHC: 32.4 g/dL (ref 30.0–36.0)
MCV: 97.2 fL (ref 78.0–100.0)
Platelets: 190 10*3/uL (ref 150–400)
RBC: 3.88 MIL/uL — ABNORMAL LOW (ref 4.22–5.81)
RDW: 14 % (ref 11.5–15.5)
WBC: 13 10*3/uL — ABNORMAL HIGH (ref 4.0–10.5)

## 2014-10-11 MED ORDER — POLYETHYLENE GLYCOL 3350 17 G PO PACK: 17.0000 g | PACK | Freq: Two times a day (BID) | ORAL | Status: DC

## 2014-10-11 MED ORDER — ASPIRIN 325 MG PO TBEC: 325.0000 mg | DELAYED_RELEASE_TABLET | Freq: Two times a day (BID) | ORAL | Status: AC

## 2014-10-11 MED ORDER — DOCUSATE SODIUM 100 MG PO CAPS: 100.0000 mg | ORAL_CAPSULE | Freq: Two times a day (BID) | ORAL | Status: DC

## 2014-10-11 MED ORDER — HYDROCODONE-ACETAMINOPHEN 7.5-325 MG PO TABS: 1.0000 | ORAL_TABLET | ORAL | Status: DC | PRN

## 2014-10-11 MED ORDER — CYCLOBENZAPRINE HCL 10 MG PO TABS: 10.0000 mg | ORAL_TABLET | Freq: Three times a day (TID) | ORAL | Status: DC | PRN

## 2014-10-11 MED ORDER — FERROUS SULFATE 325 (65 FE) MG PO TABS: 325.0000 mg | ORAL_TABLET | Freq: Three times a day (TID) | ORAL | Status: DC

## 2014-10-11 NOTE — Progress Notes (Signed)
Physical Therapy Treatment Patient Details Name: Garrett Hogan MRN: [id number] DOB: 1946-06-01 Today's Date: 10/11/2014    History of Present Illness R UKA    PT Comments    Pt progressing very well.  Follow Up Recommendations  Home health PT;Supervision/Assistance - 24 hour     Equipment Recommendations  Crutches    Recommendations for Other Services       Precautions / Restrictions Precautions Precautions: Knee Restrictions Weight Bearing Restrictions: No Other Position/Activity Restrictions: WBAT    Mobility  Bed Mobility                  Transfers Overall transfer level: Needs assistance Equipment used: Rolling walker (2 wheeled) Transfers: Sit to/from Stand Sit to Stand: Supervision         General transfer comment: cues for safety  Ambulation/Gait Ambulation/Gait assistance: Supervision Ambulation Distance (Feet): 180 Feet (x2) Assistive device: Rolling walker (2 wheeled) Gait Pattern/deviations: Step-through pattern;Decreased stride length     General Gait Details: cues for sequence   Stairs Stairs: Yes Stairs assistance: Min guard Stair Management: No rails;One rail Right;One rail Left;Step to pattern;Forwards;With crutches Number of Stairs: 4 (x2) General stair comments: cues for sequence, technique  Wheelchair Mobility    Modified Rankin (Stroke Patients Only)       Balance                                    Cognition Arousal/Alertness: Awake/alert Behavior During Therapy: WFL for tasks assessed/performed Overall Cognitive Status: Within Functional Limits for tasks assessed                      Exercises Total Joint Exercises Ankle Circles/Pumps: AROM;Both;10 reps Quad Sets: AROM;Strengthening;Both;10 reps Short Arc Quad: AROM;Right;10 reps Heel Slides: AROM;AAROM;Right;10 reps (self assisting with sheet) Hip ABduction/ADduction: AROM;Strengthening;Right;10 reps Straight Leg Raises:  AROM;Strengthening;Right;10 reps;AAROM Goniometric ROM: ~ 10 to 65* flexion    General Comments        Pertinent Vitals/Pain Pain Assessment: 0-10 Pain Score: 2  Pain Location: R knee Pain Descriptors / Indicators: Discomfort Pain Intervention(s): Limited activity within patient's tolerance;Monitored during session;Premedicated before session;Ice applied    Home Living                      Prior Function            PT Goals (current goals can now be found in the care plan section) Acute Rehab PT Goals Patient Stated Goal: to go home PT Goal Formulation: With patient/family Time For Goal Achievement: 10/14/14 Potential to Achieve Goals: Good Progress towards PT goals: Progressing toward goals    Frequency  7X/week    PT Plan Current plan remains appropriate    Co-evaluation             End of Session Equipment Utilized During Treatment: Gait belt Activity Tolerance: Patient tolerated treatment well Patient left: in chair;with call bell/phone within reach     Time: 1010-1051 PT Time Calculation (min) (ACUTE ONLY): 41 min  Charges:  $Gait Training: 23-37 mins $Therapeutic Exercise: 8-22 mins                    G Codes:  Functional Assessment Tool Used: clinical judgement Functional Limitation: Mobility: Walking and moving around Mobility: Walking and Moving Around Goal Status [phone]([id number]): At least 1 percent but less than 20 percent impaired,  limited or restricted Mobility: Walking and Moving Around Discharge Status [phone]([id number]): At least 1 percent but less than 20 percent impaired, limited or restricted   [facility] PavilionWILLIAMS,Garrett Hogan 10/11/2014, 11:12 AM

## 2014-10-11 NOTE — Progress Notes (Signed)
Patient ID: Garrett Hogan, male   DOB: [birth year], 69 y.o.   MRN: [id number] Subjective: 1 Day Post-Op Procedure(s) (LRB): RIGHT UNI-KNEE ARTHROPLASTY MEDIALLY (Right)    Patient reports pain as mild.  Doing well, no events, pleased with current status  Objective:   VITALS:   Filed Vitals:   10/11/14 0613  BP: 151/62  Pulse: 73  Temp: 98.6 F (37 C)  Resp: 18    Neurovascular intact Incision: dressing C/D/I  LABS  Recent Labs  10/11/14 0415  HGB 12.2*  HCT 37.7*  WBC 13.0*  PLT 190     Recent Labs  10/11/14 0415  NA 138  K 4.3  BUN 23*  CREATININE 1.19  GLUCOSE 140*    No results for input(s): LABPT, INR in the last 72 hours.   Assessment/Plan: 1 Day Post-Op Procedure(s) (LRB): RIGHT UNI-KNEE ARTHROPLASTY MEDIALLY (Right)   Advance diet Up with therapy Discharge home with home health versus [facility] for therapy can be picked up at our office  Reviewed goals Home today after therapy

## 2014-10-11 NOTE — Care Management Note (Signed)
Case Management Note  Patient Details  Name: Garrett Hogan MRN: [id number] Date of Birth: 06-Oct-1945  Subjective/Objective:                   Right UKR Action/Plan: Cm met with pt in room to offer choice of home health agency.  Pt chooses [facility] to render HHPT.  Address and contact information verified by pt.  CM called [facility] DME rep, Kristen to please deliver 3n1 to room prior to discharge today.  Referral for HHPT emailed to [employer], Tim.  No other CM needs were communicated.  Expected Discharge Date:                  Expected Discharge Plan:  [place]  In-House Referral:     Discharge planning Services  CM Consult  Post Acute Care Choice:  Home Health Choice offered to:  Patient  DME Arranged:  3-N-1 DME Agency:  [place]:  PT [place]:  [employer]  Status of Service:  Completed, signed off  Medicare Important Message Given:    Date Medicare IM Given:    Medicare IM give by:    Date Additional Medicare IM Given:    Additional Medicare Important Message give by:     If discussed at [place] of Stay Meetings, dates discussed:    Additional Comments:  Dellie Catholic, RN 10/11/2014, 10:28 AM

## 2014-10-11 NOTE — Evaluation (Signed)
Occupational Therapy One Time Evaluation Patient Details Name: Garrett Hogan MRN: [id number] DOB: 12-Apr-1946 Today's Date: 10/11/2014    History of Present Illness R UKA   Clinical Impression   Pt doing well. All education completed. No further acute OT needs. Pt for d/c hopefully today.    Follow Up Recommendations  No OT follow up    Equipment Recommendations  3 in 1 bedside comode    Recommendations for Other Services       Precautions / Restrictions Precautions Precautions: Knee Restrictions Weight Bearing Restrictions: No Other Position/Activity Restrictions: WBAT      Mobility Bed Mobility Overal bed mobility: Needs Assistance Bed Mobility: Supine to Sit     Supine to sit: Supervision;HOB elevated        Transfers Overall transfer level: Needs assistance Equipment used: Rolling walker (2 wheeled) Transfers: Sit to/from Stand Sit to Stand: Supervision         General transfer comment: cues for overall safety.     Balance                                            ADL Overall ADL's : Needs assistance/impaired Eating/Feeding: Independent;Sitting   Grooming: Wash/dry hands;Set up;Sitting   Upper Body Bathing: Set up;Sitting   Lower Body Bathing: Supervison/ safety;Sit to/from stand   Upper Body Dressing : Set up;Sitting   Lower Body Dressing: Supervision/safety;Sit to/from stand   Toilet Transfer: Supervision/safety;Ambulation;BSC;RW   Toileting- Clothing Manipulation and Hygiene: Supervision/safety;Sit to/from stand         General ADL Comments: Pt able to reach down to R foot for LB dressing. Educated on sequence for LB dressing and sitting down to don clothing right now then stand to pull them up. Pt states he will sponge bathe initially. Discussed tub bench option and seat if needed also. Pt stood from bed before OT ready so educated on making sure to slow down for safety. Discussed how to adjust 3in1 to  appropriate height.  Pt doing well overall.      Vision     Perception     Praxis      Pertinent Vitals/Pain Pain Assessment: 0-10 Pain Score: 2  Pain Location: R knee Pain Descriptors / Indicators: Discomfort Pain Intervention(s): Limited activity within patient's tolerance;Monitored during session;Premedicated before session;Ice applied     Hand Dominance     Extremity/Trunk Assessment Upper Extremity Assessment Upper Extremity Assessment: Overall WFL for tasks assessed           Communication Communication Communication: No difficulties   Cognition Arousal/Alertness: Awake/alert Behavior During Therapy: WFL for tasks assessed/performed Overall Cognitive Status: Within Functional Limits for tasks assessed                     General Comments       Exercises       Shoulder Instructions      Home Living Family/patient expects to be discharged to:: Private residence Living Arrangements: Spouse/significant other Available Help at Discharge: Family Type of Home: House Home Access: Stairs to enter Secretary/administratorntrance Stairs-Number of Steps: 3 Entrance Stairs-Rails: None Home Layout: Two level Alternate Level Stairs-Number of Steps: 6+6 Alternate Level Stairs-Rails: Right Bathroom Shower/Tub: Chief Strategy OfficerTub/shower unit   Bathroom Toilet: Standard     Home Equipment: Walker - 2 wheels          Prior Functioning/Environment Level of  Independence: Independent             OT Diagnosis: Generalized weakness   OT Problem List:     OT Treatment/Interventions:      OT Goals(Current goals can be found in the care plan section) Acute Rehab OT Goals Patient Stated Goal: to go home OT Goal Formulation: With patient  OT Frequency:     Barriers to D/C:            Co-evaluation              End of Session Equipment Utilized During Treatment: Rolling walker  Activity Tolerance: Patient tolerated treatment well Patient left: in chair;with call  bell/phone within reach   Time: 0925-0952 OT Time Calculation (min): 27 min Charges:  OT General Charges $OT Visit: 1 Procedure OT Evaluation $Initial OT Evaluation Tier I: 1 Procedure OT Treatments $Therapeutic Activity: 8-22 mins G-Codes:    Lennox LaityStone, [place] Didonato Stafford  [phone] 10/11/2014, 11:29 AM

## 2014-10-17 NOTE — Discharge Summary (Signed)
Physician Discharge Summary  Patient ID: Garrett CroonCecil D Hepworth MRN: [id number] DOB/AGE: [birth year] 69 y.o.  Admit date: 10/10/2014 Discharge date: 10/11/2014   Procedures:  Procedure(s) (LRB): RIGHT UNI-KNEE ARTHROPLASTY MEDIALLY (Right)  Attending Physician:  Dr. Durene RomansMatthew Olin   Admission Diagnoses:   Right knee medial compartmental primary OA /pain  Discharge Diagnoses:  Principal Problem:   S/P right UKR Active Problems:   Status post unilateral knee replacement  Past Medical History  Diagnosis Date  . Hypertension   . Arthritis   . Hepatitis     hx of Hep C - 1970s     HPI:    Garrett Hogan, 69 y.o. male male, has a history of pain and functional disability in the right and has failed non-surgical conservative treatments for greater than 12 weeks to include NSAID's and/or analgesics, corticosteriod injections, viscosupplementation injections, use of assistive devices and activity modification. Onset of symptoms was gradual, starting 2+ years ago with gradually worsening course since that time. The patient noted no past surgery on the right knee(s). Patient currently rates pain in the right knee(s) at 8 out of 10 with activity. Patient has night pain, worsening of pain with activity and weight bearing, pain that interferes with activities of daily living, pain with passive range of motion, crepitus and joint swelling. Patient has evidence of periarticular osteophytes and joint space narrowing of the medial compartment by imaging studies. There is no active infection. Risks, benefits and expectations were discussed with the patient. Risks including but not limited to the risk of anesthesia, blood clots, nerve damage, blood vessel damage, failure of the prosthesis, infection and up to and including death. Patient understand the risks, benefits and expectations and wishes to proceed with surgery.   PCP: Garrett Hogan,PATRICK, Garrett Hogan   Discharged Condition: good  Hospital  Course:  Patient underwent the above stated procedure on 10/10/2014. Patient tolerated the procedure well and brought to the recovery room in good condition and subsequently to the floor.  POD #1 BP: 151/62 ; Pulse: 73 ; Temp: 98.6 F (37 C) ; Resp: 18 Patient reports pain as mild. Doing well, no events, pleased with current status. Dorsiflexion/plantar flexion intact, incision: dressing C/D/I, no cellulitis present and compartment soft.   LABS  Basename    HGB  12.2  HCT  37.7    Discharge Exam: General appearance: alert, cooperative and no distress Extremities: Homans sign is negative, no sign of DVT, no edema, redness or tenderness in the calves or thighs and no ulcers, gangrene or trophic changes  Disposition: Home with follow up in 2 weeks   Follow-up Information    Follow up with [name],Decie Verne D, MD. Schedule an appointment as soon as possible for a visit in 2 weeks.   Specialty:  Orthopedic Surgery   Contact information:   [address] [place] [address] [phone]       [facility]   Why:  home health physical therapy   Contact information:   [address] [place] [address] [phone]       Follow up with [facility].   Why:  3n1 (over the commode seat)   Contact information:   [address] [place] [address] [phone]       Discharge Instructions    Call MD / Call 911    Complete by:  As directed   If you experience chest pain or shortness of breath, CALL  911 and be transported to the hospital emergency room.  If you develope a fever above 101 F, pus (white drainage) or increased drainage or redness at the wound, or calf pain, call your surgeon's office.     Change dressing    Complete by:  As directed   Maintain surgical dressing until follow up in the clinic. If the edges start to pull up, may reinforce with tape. If the dressing is no longer working, may remove  and cover with gauze and tape, but must keep the area dry and clean.  Call with any questions or concerns.     Constipation Prevention    Complete by:  As directed   Drink plenty of fluids.  Prune juice may be helpful.  You may use a stool softener, such as Colace (over the counter) 100 mg twice a day.  Use MiraLax (over the counter) for constipation as needed.     Diet - low sodium heart healthy    Complete by:  As directed      Discharge instructions    Complete by:  As directed   Maintain surgical dressing until follow up in the clinic. If the edges start to pull up, may reinforce with tape. If the dressing is no longer working, may remove and cover with gauze and tape, but must keep the area dry and clean.  [facility]. Call with any questions or concerns.     Increase activity slowly as tolerated    Complete by:  As directed      TED hose    Complete by:  As directed   Use stockings (TED hose) for 2 weeks on both leg(s).  You may remove them at night for sleeping.     Weight bearing as tolerated    Complete by:  As directed   Laterality:  right  Extremity:  Lower             Medication List    STOP taking these medications        meloxicam 15 MG tablet  Commonly known as:  MOBIC      TAKE these medications        aspirin 325 MG EC tablet  Take 1 tablet (325 mg total) by mouth 2 (two) times daily.     cyclobenzaprine 10 MG tablet  Commonly known as:  FLEXERIL  Take 1 tablet (10 mg total) by mouth 3 (three) times daily as needed for muscle spasms.     DILT-XR 240 MG 24 hr capsule  Generic drug:  diltiazem  Take 240 mg by mouth at bedtime.     docusate sodium 100 MG capsule  Commonly known as:  COLACE  Take 1 capsule (100 mg total) by mouth 2 (two) times daily.     ferrous sulfate 325 (65 FE) MG tablet  Take 1 tablet (325 mg total) by mouth 3 (three) times daily after meals.     HYDROcodone-acetaminophen 7.5-325 MG per tablet    Commonly known as:  NORCO  Take 1-2 tablets by mouth every 4 (four) hours as needed for moderate pain.     polyethylene glycol packet  Commonly known as:  MIRALAX / GLYCOLAX  Take 17 g by mouth 2 (two) times daily.         Signed: Anastasio AuerbachMatthew S. Oneil Behney   Garrett Hogan  10/17/2014, 3:02 PM

## 2014-11-18 NOTE — Progress Notes (Signed)
   10/11/14 1128  OT G-codes **NOT FOR INPATIENT CLASS**  Functional Assessment Tool Used clinical judgement  Functional Limitation Self care  Self Care Current Status [phone]) CI  Self Care Goal Status (B3532) CI  Self Care Discharge Status [phone]) CI  Judithann Sauger OTR/L [phone] 11/18/2014   Late entry for 10/11/14

## 2016-06-11 DIAGNOSIS — M21612 Bunion of left foot: Secondary | ICD-10-CM | POA: Diagnosis not present

## 2016-06-11 DIAGNOSIS — M79606 Pain in leg, unspecified: Secondary | ICD-10-CM | POA: Diagnosis not present

## 2016-06-11 DIAGNOSIS — M21611 Bunion of right foot: Secondary | ICD-10-CM | POA: Diagnosis not present

## 2016-06-11 DIAGNOSIS — B351 Tinea unguium: Secondary | ICD-10-CM | POA: Diagnosis not present

## 2016-06-27 DIAGNOSIS — G8929 Other chronic pain: Secondary | ICD-10-CM | POA: Diagnosis not present

## 2016-06-27 DIAGNOSIS — M25562 Pain in left knee: Secondary | ICD-10-CM | POA: Diagnosis not present

## 2016-06-27 DIAGNOSIS — M1712 Unilateral primary osteoarthritis, left knee: Secondary | ICD-10-CM | POA: Diagnosis not present

## 2016-07-11 NOTE — H&P (Signed)
UNICOMPARTMENTAL KNEE ADMISSION H&P  Patient is being admitted for left medial unicompartmental knee arthroplasty.  Subjective:  Chief Complaint:  Left knee medial compartmental primary OA /pain    HPI: Garrett Hogan, 71 y.o. male male, has a history of pain and functional disability in the left and has failed non-surgical conservative treatments for greater than 12 weeks to include NSAID's and/or analgesics, corticosteriod injections, use of assistive devices and activity modification.  Onset of symptoms was gradual, starting  years ago with gradually worsening course since that time. The patient noted prior procedures on the knee to include  unicompartmental arthroplasty on the right knee(s).  Patient currently rates pain in the left knee(s) at 7 out of 10 with activity. Patient has night pain, worsening of pain with activity and weight bearing, pain that interferes with activities of daily living, pain with passive range of motion, crepitus and joint swelling.  Patient has evidence of periarticular osteophytes and joint space narrowing of the medial compartment by imaging studies.  There is no active infection.  Risks, benefits and expectations were discussed with the patient.  Risks including but not limited to the risk of anesthesia, blood clots, nerve damage, blood vessel damage, failure of the prosthesis, infection and up to and including death.  Patient understand the risks, benefits and expectations and wishes to proceed with surgery.   PCP: Vonna Kotyk  D/C Plans:      Home  Post-op Meds:       No Rx given  Tranexamic Acid:      To be given - IV    Decadron:    It is to be given  FYI:     ASA  Norco    Past Medical History:  Diagnosis Date  . Arthritis   . Hepatitis    hx of Hep C - 1970s   . Hypertension      Past Surgical History:  Procedure Laterality Date  . PARTIAL KNEE ARTHROPLASTY Right 10/10/2014   Procedure: RIGHT UNI-KNEE ARTHROPLASTY  MEDIALLY;  Surgeon: Durene Romans, MD;  Location: [facility];  Service: Orthopedics;  Laterality: Right;  Marland Kitchen VARICOSE VEIN SURGERY     left leg     No Known Allergies  Social History  Substance Use Topics  . Smoking status: Former Games developer  . Smokeless tobacco: Never Used  . Alcohol use Yes     Comment: drinks wine once per month        Review of Systems  Constitutional: Negative.   HENT: Negative.   Eyes: Negative.   Respiratory: Negative.   Cardiovascular: Negative.   Gastrointestinal: Negative.   Genitourinary: Negative.   Musculoskeletal: Positive for back pain and joint pain.  Skin: Negative.   Neurological: Negative.   Endo/Heme/Allergies: Negative.   Psychiatric/Behavioral: Negative.      Objective:   Physical Exam  Constitutional: He is oriented to person, place, and time and well-developed, well-nourished, and in no distress.  HENT:  Head: Normocephalic.  Eyes: Pupils are equal, round, and reactive to light.  Neck: Neck supple. No JVD present. No tracheal deviation present. No thyromegaly present.  Cardiovascular: Normal rate, regular rhythm and intact distal pulses.   Pulmonary/Chest: Effort normal and breath sounds normal. No respiratory distress.  Abdominal: Soft. There is no tenderness. There is no guarding.  Musculoskeletal:       Left knee: He exhibits decreased range of motion, swelling and bony tenderness. He exhibits no ecchymosis, no deformity, no laceration and no erythema. Tenderness found. Medial  joint line tenderness noted. No lateral joint line tenderness noted.  Lymphadenopathy:    He has no cervical adenopathy.  Neurological: He is alert and oriented to person, place, and time.  Skin: Skin is warm and dry.  Psychiatric: Affect normal.       Labs:  Estimated body mass index is 30.37 kg/m as calculated from the following:   Height as of 10/10/14: 6\' 3"  (1.905 m).   Weight as of 10/10/14: 110.2 kg (243 lb).   Imaging Review Plain  radiographs demonstrate severe degenerative joint disease of the left knee(s) medial compartment.  The bone quality appears to be good for age and reported activity level.  Assessment/Plan:  End stage arthritis, left knee medial compartment  The patient history, physical examination, clinical judgment of the provider and imaging studies are consistent with end stage degenerative joint disease of the left knee(s) and medial unicompartmental knee arthroplasty is deemed medically necessary. The treatment options including medical management, injection therapy arthroscopy and arthroplasty were discussed at length. The risks and benefits of total knee arthroplasty were presented and reviewed. The risks due to aseptic loosening, infection, stiffness, patella tracking problems, thromboembolic complications and other imponderables were discussed. The patient acknowledged the explanation, agreed to proceed with the plan and consent was signed. Patient is being admitted for outpatient / observation treatment for surgery, pain control, PT, OT, prophylactic antibiotics, VTE prophylaxis, progressive ambulation and ADL's and discharge planning. The patient is planning to be discharged home.    Anastasio AuerbachMatthew S. Page Lancon   PA-C  07/11/2016, 1:02 PM

## 2016-07-12 ENCOUNTER — Encounter (HOSPITAL_COMMUNITY): Payer: Self-pay

## 2016-07-12 ENCOUNTER — Other Ambulatory Visit (HOSPITAL_COMMUNITY): Payer: Self-pay | Admitting: Emergency Medicine

## 2016-07-12 NOTE — Patient Instructions (Addendum)
Weyman CroonCecil D Roseman  07/12/2016   Your procedure is scheduled on: 07-23-16  [facility]  [facility] 3rd floor to  Short Stay Center at 7AM.  Call this number if you have problems the morning of surgery [phone]   Remember: ONLY 1 PERSON MAY GO WITH YOU TO SHORT STAY TO GET  READY MORNING OF YOUR SURGERY.  Do not eat food or drink liquids :After Midnight.     Take these medicines the morning of surgery with A SIP OF WATER: clonidine(Catapres)                                You may not have any metal on your body including hair pins and              piercings  Do not wear jewelry, make-up, lotions, powders or perfumes, deodorant             Do not wear nail polish.  Do not shave  48 hours prior to surgery.              Men may shave face and neck.   Do not bring valuables to the hospital. [facility] IS NOT             RESPONSIBLE   FOR VALUABLES.  Contacts, dentures or bridgework may not be worn into surgery.  Leave suitcase in the car. After surgery it may be brought to your room.              Please read over the following fact sheets you were given: _____________________________________________________________________             [facility] - Preparing for Surgery Before surgery, you can play an important role.  Because skin is not sterile, your skin needs to be as free of germs as possible.  You can reduce the number of germs on your skin by washing with CHG (chlorahexidine gluconate) soap before surgery.  CHG is an antiseptic cleaner which kills germs and bonds with the skin to continue killing germs even after washing. Please DO NOT use if you have an allergy to CHG or antibacterial soaps.  If your skin becomes reddened/irritated stop using the CHG and inform your nurse when you arrive at Short Stay. Do not shave (including legs and underarms) for at least 48 hours prior to the first CHG shower.  You may shave your  face/neck. Please follow these instructions carefully:  1.  Shower with CHG Soap the night before surgery and the  morning of Surgery.  2.  If you choose to wash your hair, wash your hair first as usual with your  normal  shampoo.  3.  After you shampoo, rinse your hair and body thoroughly to remove the  shampoo.                           4.  Use CHG as you would any other liquid soap.  You can apply chg directly  to the skin and wash                       Gently with a scrungie or clean washcloth.  5.  Apply the CHG Soap to your body ONLY FROM  THE NECK DOWN.   Do not use on face/ open                           Wound or open sores. Avoid contact with eyes, ears mouth and genitals (private parts).                       Wash face,  Genitals (private parts) with your normal soap.             6.  Wash thoroughly, paying special attention to the area where your surgery  will be performed.  7.  Thoroughly rinse your body with warm water from the neck down.  8.  DO NOT shower/wash with your normal soap after using and rinsing off  the CHG Soap.                9.  Pat yourself dry with a clean towel.            10.  Wear clean pajamas.            11.  Place clean sheets on your bed the night of your first shower and do not  sleep with pets. Day of Surgery : Do not apply any lotions/deodorants the morning of surgery.  Please wear clean clothes to the hospital/surgery center.  FAILURE TO FOLLOW THESE INSTRUCTIONS MAY RESULT IN THE CANCELLATION OF YOUR SURGERY PATIENT SIGNATURE_________________________________  NURSE SIGNATURE__________________________________  ________________________________________________________________________   Adam Phenix  An incentive spirometer is a tool that can help keep your lungs clear and active. This tool measures how well you are filling your lungs with each breath. Taking long deep breaths may help reverse or decrease the chance of developing breathing  (pulmonary) problems (especially infection) following:  A long period of time when you are unable to move or be active. BEFORE THE PROCEDURE   If the spirometer includes an indicator to show your best effort, your nurse or respiratory therapist will set it to a desired goal.  If possible, sit up straight or lean slightly forward. Try not to slouch.  Hold the incentive spirometer in an upright position. INSTRUCTIONS FOR USE  1. Sit on the edge of your bed if possible, or sit up as far as you can in bed or on a chair. 2. Hold the incentive spirometer in an upright position. 3. Breathe out normally. 4. Place the mouthpiece in your mouth and seal your lips tightly around it. 5. Breathe in slowly and as deeply as possible, raising the piston or the ball toward the top of the column. 6. Hold your breath for 3-5 seconds or for as long as possible. Allow the piston or ball to fall to the bottom of the column. 7. Remove the mouthpiece from your mouth and breathe out normally. 8. Rest for a few seconds and repeat Steps 1 through 7 at least 10 times every 1-2 hours when you are awake. Take your time and take a few normal breaths between deep breaths. 9. The spirometer may include an indicator to show your best effort. Use the indicator as a goal to work toward during each repetition. 10. After each set of 10 deep breaths, practice coughing to be sure your lungs are clear. If you have an incision (the cut made at the time of surgery), support your incision when coughing by placing a pillow or rolled up towels firmly against it. Once you are  able to get out of bed, walk around indoors and cough well. You may stop using the incentive spirometer when instructed by your caregiver.  RISKS AND COMPLICATIONS  Take your time so you do not get dizzy or light-headed.  If you are in pain, you may need to take or ask for pain medication before doing incentive spirometry. It is harder to take a deep breath if you  are having pain. AFTER USE  Rest and breathe slowly and easily.  It can be helpful to keep track of a log of your progress. Your caregiver can provide you with a simple table to help with this. If you are using the spirometer at home, follow these instructions: [place] IF:   You are having difficultly using the spirometer.  You have trouble using the spirometer as often as instructed.  Your pain medication is not giving enough relief while using the spirometer.  You develop fever of 100.5 F (38.1 C) or higher. SEEK IMMEDIATE MEDICAL CARE IF:   You cough up bloody sputum that had not been present before.  You develop fever of 102 F (38.9 C) or greater.  You develop worsening pain at or near the incision site. MAKE SURE YOU:   Understand these instructions.  Will watch your condition.  Will get help right away if you are not doing well or get worse. Document Released: 09/30/2006 Document Revised: 08/12/2011 Document Reviewed: 12/01/2006 ExitCare Patient Information 2014 Exit[employer], [place].   ________________________________________________________________________  WHAT IS A BLOOD TRANSFUSION? Blood Transfusion Information  A transfusion is the replacement of blood or some of its parts. Blood is made up of multiple cells which provide different functions.  Red blood cells carry oxygen and are used for blood loss replacement.  White blood cells fight against infection.  Platelets control bleeding.  Plasma helps clot blood.  Other blood products are available for specialized needs, such as hemophilia or other clotting disorders. BEFORE THE TRANSFUSION  Who gives blood for transfusions?   Healthy volunteers who are fully evaluated to make sure their blood is safe. This is blood bank blood. Transfusion therapy is the safest it has ever been in the practice of medicine. Before blood is taken from a donor, a complete history is taken to make sure that person has  no history of diseases nor engages in risky social behavior (examples are intravenous drug use or sexual activity with multiple partners). The donor's travel history is screened to minimize risk of transmitting infections, such as malaria. The donated blood is tested for signs of infectious diseases, such as HIV and hepatitis. The blood is then tested to be sure it is compatible with you in order to minimize the chance of a transfusion reaction. If you or a relative donates blood, this is often done in anticipation of surgery and is not appropriate for emergency situations. It takes many days to process the donated blood. RISKS AND COMPLICATIONS Although transfusion therapy is very safe and saves many lives, the main dangers of transfusion include:   Getting an infectious disease.  Developing a transfusion reaction. This is an allergic reaction to something in the blood you were given. Every precaution is taken to prevent this. The decision to have a blood transfusion has been considered carefully by your caregiver before blood is given. Blood is not given unless the benefits outweigh the risks. AFTER THE TRANSFUSION  Right after receiving a blood transfusion, you will usually feel much better and more energetic. This is especially  true if your red blood cells have gotten low (anemic). The transfusion raises the level of the red blood cells which carry oxygen, and this usually causes an energy increase.  The nurse administering the transfusion will monitor you carefully for complications. HOME CARE INSTRUCTIONS  No special instructions are needed after a transfusion. You may find your energy is better. Speak with your caregiver about any limitations on activity for underlying diseases you may have. SEEK MEDICAL CARE IF:   Your condition is not improving after your transfusion.  You develop redness or irritation at the intravenous (IV) site. SEEK IMMEDIATE MEDICAL CARE IF:  Any of the following  symptoms occur over the next 12 hours:  Shaking chills.  You have a temperature by mouth above 102 F (38.9 C), not controlled by medicine.  Chest, back, or muscle pain.  People around you feel you are not acting correctly or are confused.  Shortness of breath or difficulty breathing.  Dizziness and fainting.  You get a rash or develop hives.  You have a decrease in urine output.  Your urine turns a dark color or changes to pink, red, or brown. Any of the following symptoms occur over the next 10 days:  You have a temperature by mouth above 102 F (38.9 C), not controlled by medicine.  Shortness of breath.  Weakness after normal activity.  The white part of the eye turns yellow (jaundice).  You have a decrease in the amount of urine or are urinating less often.  Your urine turns a dark color or changes to pink, red, or brown. Document Released: 05/17/2000 Document Revised: 08/12/2011 Document Reviewed: 01/04/2008 [facility] Patient Information 2014 [place], [place].  _______________________________________________________________________

## 2016-07-12 NOTE — Progress Notes (Signed)
LOV Watterson PA-C 05-15-16 on chart EKG 05-01-16 on chart ECHO 09-04-15 on chart

## 2016-07-16 ENCOUNTER — Encounter (HOSPITAL_COMMUNITY): Payer: Self-pay

## 2016-07-16 ENCOUNTER — Encounter (INDEPENDENT_AMBULATORY_CARE_PROVIDER_SITE_OTHER): Payer: Self-pay

## 2016-07-16 ENCOUNTER — Encounter (HOSPITAL_COMMUNITY)
Admission: RE | Admit: 2016-07-16 | Discharge: 2016-07-16 | Disposition: A | Discharge status: Home / Self Care | Payer: Medicare Other | Source: Ambulatory Visit | Attending: Orthopedic Surgery | Admitting: Orthopedic Surgery

## 2016-07-16 DIAGNOSIS — Z01810 Encounter for preprocedural cardiovascular examination: Secondary | ICD-10-CM | POA: Diagnosis not present

## 2016-07-16 DIAGNOSIS — Z01812 Encounter for preprocedural laboratory examination: Secondary | ICD-10-CM | POA: Diagnosis not present

## 2016-07-16 DIAGNOSIS — M1712 Unilateral primary osteoarthritis, left knee: Secondary | ICD-10-CM | POA: Insufficient documentation

## 2016-07-16 HISTORY — DX: Gastro-esophageal reflux disease without esophagitis: K21.9

## 2016-07-16 HISTORY — DX: Chronic kidney disease, stage 3 unspecified: N18.30

## 2016-07-16 HISTORY — DX: Chronic kidney disease, stage 3 (moderate): N18.3

## 2016-07-16 HISTORY — DX: Headache: R51

## 2016-07-16 HISTORY — DX: Benign prostatic hyperplasia without lower urinary tract symptoms: N40.0

## 2016-07-16 HISTORY — DX: Headache, unspecified: R51.9

## 2016-07-16 HISTORY — DX: Atherosclerotic heart disease of native coronary artery without angina pectoris: I25.10

## 2016-07-16 HISTORY — DX: Cardiomegaly: I51.7

## 2016-07-16 HISTORY — DX: Cardiac murmur, unspecified: R01.1

## 2016-07-16 HISTORY — DX: Pure hypercholesterolemia, unspecified: E78.00

## 2016-07-16 LAB — CBC
HCT: 40.6 % (ref 39.0–52.0)
Hemoglobin: 13.3 g/dL (ref 13.0–17.0)
MCH: 31.3 pg (ref 26.0–34.0)
MCHC: 32.8 g/dL (ref 30.0–36.0)
MCV: 95.5 fL (ref 78.0–100.0)
PLATELETS: 202 10*3/uL (ref 150–400)
RBC: 4.25 MIL/uL (ref 4.22–5.81)
RDW: 14.1 % (ref 11.5–15.5)
WBC: 5.2 10*3/uL (ref 4.0–10.5)

## 2016-07-16 LAB — BASIC METABOLIC PANEL
Anion gap: 7 (ref 5–15)
BUN: 25 mg/dL — AB (ref 6–20)
CHLORIDE: 105 mmol/L (ref 101–111)
CO2: 26 mmol/L (ref 22–32)
CREATININE: 1.54 mg/dL — AB (ref 0.61–1.24)
Calcium: 9.4 mg/dL (ref 8.9–10.3)
GFR calc Af Amer: 51 mL/min — ABNORMAL LOW (ref >60)
GFR, EST NON AFRICAN AMERICAN: 44 mL/min — AB (ref >60)
GLUCOSE: 100 mg/dL — AB (ref 65–99)
POTASSIUM: 4 mmol/L (ref 3.5–5.1)
SODIUM: 138 mmol/L (ref 135–145)

## 2016-07-16 LAB — SURGICAL PCR SCREEN
MRSA, PCR: NEGATIVE
Staphylococcus aureus: POSITIVE — AB

## 2016-07-16 NOTE — Progress Notes (Signed)
Patient informed consent order incorrect at time of pre-op. Spoke with Cordelia PenSherry at [employer] office and she will have him correct it. Patient will sign consent day of surgery

## 2016-07-16 NOTE — Progress Notes (Signed)
Bmp results routed via epic to dr Charlann Boxerolin

## 2016-07-23 ENCOUNTER — Observation Stay (HOSPITAL_COMMUNITY)
Admission: RE | Admit: 2016-07-23 | Discharge: 2016-07-24 | Disposition: A | Discharge status: Home / Self Care | Payer: Medicare Other | Source: Ambulatory Visit | Attending: Orthopedic Surgery | Admitting: Orthopedic Surgery

## 2016-07-23 ENCOUNTER — Encounter (HOSPITAL_COMMUNITY)
Admission: RE | Disposition: A | Discharge status: Home / Self Care | Payer: Self-pay | Source: Ambulatory Visit | Attending: Orthopedic Surgery

## 2016-07-23 ENCOUNTER — Inpatient Hospital Stay (HOSPITAL_COMMUNITY): Payer: Medicare Other | Admitting: Anesthesiology

## 2016-07-23 ENCOUNTER — Encounter (HOSPITAL_COMMUNITY): Payer: Self-pay | Admitting: *Deleted

## 2016-07-23 DIAGNOSIS — B1920 Unspecified viral hepatitis C without hepatic coma: Secondary | ICD-10-CM | POA: Insufficient documentation

## 2016-07-23 DIAGNOSIS — K219 Gastro-esophageal reflux disease without esophagitis: Secondary | ICD-10-CM | POA: Diagnosis not present

## 2016-07-23 DIAGNOSIS — M25762 Osteophyte, left knee: Secondary | ICD-10-CM | POA: Insufficient documentation

## 2016-07-23 DIAGNOSIS — I2510 Atherosclerotic heart disease of native coronary artery without angina pectoris: Secondary | ICD-10-CM | POA: Diagnosis not present

## 2016-07-23 DIAGNOSIS — I517 Cardiomegaly: Secondary | ICD-10-CM | POA: Insufficient documentation

## 2016-07-23 DIAGNOSIS — E7800 Pure hypercholesterolemia, unspecified: Secondary | ICD-10-CM | POA: Insufficient documentation

## 2016-07-23 DIAGNOSIS — E669 Obesity, unspecified: Secondary | ICD-10-CM | POA: Diagnosis present

## 2016-07-23 DIAGNOSIS — N183 Chronic kidney disease, stage 3 (moderate): Secondary | ICD-10-CM | POA: Diagnosis not present

## 2016-07-23 DIAGNOSIS — Z7982 Long term (current) use of aspirin: Secondary | ICD-10-CM | POA: Diagnosis not present

## 2016-07-23 DIAGNOSIS — Z87891 Personal history of nicotine dependence: Secondary | ICD-10-CM | POA: Insufficient documentation

## 2016-07-23 DIAGNOSIS — N400 Enlarged prostate without lower urinary tract symptoms: Secondary | ICD-10-CM | POA: Insufficient documentation

## 2016-07-23 DIAGNOSIS — I129 Hypertensive chronic kidney disease with stage 1 through stage 4 chronic kidney disease, or unspecified chronic kidney disease: Secondary | ICD-10-CM | POA: Insufficient documentation

## 2016-07-23 DIAGNOSIS — M1712 Unilateral primary osteoarthritis, left knee: Principal | ICD-10-CM | POA: Insufficient documentation

## 2016-07-23 DIAGNOSIS — G8918 Other acute postprocedural pain: Secondary | ICD-10-CM | POA: Diagnosis not present

## 2016-07-23 DIAGNOSIS — Z96652 Presence of left artificial knee joint: Secondary | ICD-10-CM

## 2016-07-23 DIAGNOSIS — Z6830 Body mass index (BMI) 30.0-30.9, adult: Secondary | ICD-10-CM | POA: Diagnosis not present

## 2016-07-23 HISTORY — PX: PARTIAL KNEE ARTHROPLASTY: SHX2174

## 2016-07-23 LAB — TYPE AND SCREEN
ABO/RH(D): O POS
Antibody Screen: NEGATIVE

## 2016-07-23 SURGERY — ARTHROPLASTY, KNEE, UNICOMPARTMENTAL
Anesthesia: Monitor Anesthesia Care | Site: Knee | Laterality: Left

## 2016-07-23 MED ORDER — METOCLOPRAMIDE HCL 5 MG PO TABS: 5.0000 mg | ORAL_TABLET | Freq: Three times a day (TID) | ORAL | Status: DC | PRN

## 2016-07-23 MED ORDER — DEXAMETHASONE SODIUM PHOSPHATE 10 MG/ML IJ SOLN
INTRAMUSCULAR | Status: AC
  Filled 2016-07-23: qty 1

## 2016-07-23 MED ORDER — DEXAMETHASONE SODIUM PHOSPHATE 10 MG/ML IJ SOLN: 10.0000 mg | Freq: Once | INTRAMUSCULAR | Status: DC

## 2016-07-23 MED ORDER — KETOROLAC TROMETHAMINE 30 MG/ML IJ SOLN
INTRAMUSCULAR | Status: DC | PRN
  Administered 2016-07-23: 30 mg

## 2016-07-23 MED ORDER — DIPHENHYDRAMINE HCL 25 MG PO CAPS: 25.0000 mg | ORAL_CAPSULE | Freq: Four times a day (QID) | ORAL | Status: DC | PRN

## 2016-07-23 MED ORDER — MENTHOL 3 MG MT LOZG: 1.0000 | LOZENGE | OROMUCOSAL | Status: DC | PRN

## 2016-07-23 MED ORDER — ASPIRIN 81 MG PO CHEW: 81.0000 mg | CHEWABLE_TABLET | Freq: Two times a day (BID) | ORAL | 0 refills | Status: DC

## 2016-07-23 MED ORDER — CLONIDINE HCL 0.1 MG PO TABS
0.2000 mg | ORAL_TABLET | Freq: Two times a day (BID) | ORAL | Status: DC
  Administered 2016-07-23 – 2016-07-24 (×2): 0.2 mg via ORAL
  Filled 2016-07-23 (×2): qty 2

## 2016-07-23 MED ORDER — METOCLOPRAMIDE HCL 5 MG/ML IJ SOLN
5.0000 mg | Freq: Three times a day (TID) | INTRAMUSCULAR | Status: DC | PRN
  Filled 2016-07-23: qty 2

## 2016-07-23 MED ORDER — ASPIRIN EC 325 MG PO TBEC
325.0000 mg | DELAYED_RELEASE_TABLET | Freq: Two times a day (BID) | ORAL | Status: DC
  Administered 2016-07-24: 325 mg via ORAL
  Filled 2016-07-23: qty 1

## 2016-07-23 MED ORDER — PHENOL 1.4 % MT LIQD
1.0000 | OROMUCOSAL | Status: DC | PRN
Start: 2016-07-23 — End: 2016-07-24

## 2016-07-23 MED ORDER — METHOCARBAMOL 500 MG PO TABS
500.0000 mg | ORAL_TABLET | Freq: Four times a day (QID) | ORAL | Status: DC | PRN
  Administered 2016-07-23: 500 mg via ORAL
  Filled 2016-07-23: qty 1

## 2016-07-23 MED ORDER — CEFAZOLIN SODIUM-DEXTROSE 2-4 GM/100ML-% IV SOLN
2.0000 g | INTRAVENOUS | Status: AC
  Administered 2016-07-23: 2 g via INTRAVENOUS
  Filled 2016-07-23: qty 100

## 2016-07-23 MED ORDER — PROPOFOL 10 MG/ML IV BOLUS
INTRAVENOUS | Status: AC
  Filled 2016-07-23: qty 60

## 2016-07-23 MED ORDER — ROPIVACAINE HCL 7.5 MG/ML IJ SOLN
INTRAMUSCULAR | Status: AC
  Filled 2016-07-23: qty 20

## 2016-07-23 MED ORDER — SODIUM CHLORIDE 0.9 % IJ SOLN
INTRAMUSCULAR | Status: AC
  Filled 2016-07-23: qty 50

## 2016-07-23 MED ORDER — ONDANSETRON HCL 4 MG/2ML IJ SOLN
4.0000 mg | Freq: Four times a day (QID) | INTRAMUSCULAR | Status: DC | PRN
  Administered 2016-07-23: 4 mg via INTRAVENOUS
  Filled 2016-07-23: qty 2

## 2016-07-23 MED ORDER — BUPIVACAINE HCL (PF) 0.25 % IJ SOLN
INTRAMUSCULAR | Status: AC
  Filled 2016-07-23: qty 30

## 2016-07-23 MED ORDER — DOCUSATE SODIUM 100 MG PO CAPS
100.0000 mg | ORAL_CAPSULE | Freq: Two times a day (BID) | ORAL | Status: DC
  Administered 2016-07-23: 100 mg via ORAL
  Filled 2016-07-23: qty 1

## 2016-07-23 MED ORDER — METHOCARBAMOL 1000 MG/10ML IJ SOLN
500.0000 mg | Freq: Four times a day (QID) | INTRAVENOUS | Status: DC | PRN
  Administered 2016-07-23: 500 mg via INTRAVENOUS
  Filled 2016-07-23: qty 5
  Filled 2016-07-23: qty 550

## 2016-07-23 MED ORDER — BISACODYL 10 MG RE SUPP: 10.0000 mg | Freq: Every day | RECTAL | Status: DC | PRN

## 2016-07-23 MED ORDER — LIDOCAINE 2% (20 MG/ML) 5 ML SYRINGE
INTRAMUSCULAR | Status: DC | PRN
  Administered 2016-07-23: 40 mg via INTRAVENOUS

## 2016-07-23 MED ORDER — MIDAZOLAM HCL 2 MG/2ML IJ SOLN: 0.5000 mg | Freq: Once | INTRAMUSCULAR | Status: DC | PRN

## 2016-07-23 MED ORDER — SODIUM CHLORIDE 0.9 % IR SOLN
Status: DC | PRN
  Administered 2016-07-23: 1000 mL

## 2016-07-23 MED ORDER — HYDROMORPHONE HCL 1 MG/ML IJ SOLN
0.5000 mg | INTRAMUSCULAR | Status: DC | PRN
  Administered 2016-07-23: 1 mg via INTRAVENOUS
  Filled 2016-07-23: qty 1

## 2016-07-23 MED ORDER — ONDANSETRON HCL 4 MG/2ML IJ SOLN
INTRAMUSCULAR | Status: AC
  Filled 2016-07-23: qty 2

## 2016-07-23 MED ORDER — CEFAZOLIN SODIUM-DEXTROSE 2-4 GM/100ML-% IV SOLN
2.0000 g | Freq: Four times a day (QID) | INTRAVENOUS | Status: AC
  Administered 2016-07-23 (×2): 2 g via INTRAVENOUS
  Filled 2016-07-23 (×2): qty 100

## 2016-07-23 MED ORDER — MIDAZOLAM HCL 2 MG/2ML IJ SOLN
1.0000 mg | INTRAMUSCULAR | Status: DC | PRN
  Administered 2016-07-23: 2 mg via INTRAVENOUS
  Filled 2016-07-23: qty 2

## 2016-07-23 MED ORDER — BUPIVACAINE IN DEXTROSE 0.75-8.25 % IT SOLN
INTRATHECAL | Status: DC | PRN
  Administered 2016-07-23: 15 mg via INTRATHECAL

## 2016-07-23 MED ORDER — DEXAMETHASONE SODIUM PHOSPHATE 10 MG/ML IJ SOLN
10.0000 mg | Freq: Once | INTRAMUSCULAR | Status: AC
  Administered 2016-07-24: 10 mg via INTRAVENOUS
  Filled 2016-07-23: qty 1

## 2016-07-23 MED ORDER — FERROUS SULFATE 325 (65 FE) MG PO TABS: 325.0000 mg | ORAL_TABLET | Freq: Three times a day (TID) | ORAL | Status: DC

## 2016-07-23 MED ORDER — ATORVASTATIN CALCIUM 10 MG PO TABS
10.0000 mg | ORAL_TABLET | Freq: Every day | ORAL | Status: DC
  Administered 2016-07-23: 10 mg via ORAL
  Filled 2016-07-23: qty 1

## 2016-07-23 MED ORDER — SODIUM CHLORIDE 0.9 % IJ SOLN
INTRAMUSCULAR | Status: DC | PRN
  Administered 2016-07-23: 50 mL

## 2016-07-23 MED ORDER — MIDAZOLAM HCL 2 MG/2ML IJ SOLN
INTRAMUSCULAR | Status: DC | PRN
Start: 2016-07-23 — End: 2016-07-23
  Administered 2016-07-23 (×2): 1 mg via INTRAVENOUS

## 2016-07-23 MED ORDER — SODIUM CHLORIDE 0.9 % IV SOLN
INTRAVENOUS | Status: DC
  Administered 2016-07-23: 16:00:00 via INTRAVENOUS
  Filled 2016-07-23 (×5): qty 1000

## 2016-07-23 MED ORDER — PROPOFOL 500 MG/50ML IV EMUL: INTRAVENOUS | Status: DC | PRN

## 2016-07-23 MED ORDER — PROPOFOL 500 MG/50ML IV EMUL
INTRAVENOUS | Status: DC | PRN
  Administered 2016-07-23: 50 ug/kg/min via INTRAVENOUS

## 2016-07-23 MED ORDER — CYCLOBENZAPRINE HCL 10 MG PO TABS: 10.0000 mg | ORAL_TABLET | Freq: Three times a day (TID) | ORAL | 0 refills | Status: DC | PRN

## 2016-07-23 MED ORDER — ONDANSETRON HCL 4 MG PO TABS: 4.0000 mg | ORAL_TABLET | Freq: Four times a day (QID) | ORAL | Status: DC | PRN

## 2016-07-23 MED ORDER — DILTIAZEM HCL ER BEADS 240 MG PO CP24
360.0000 mg | ORAL_CAPSULE | Freq: Every day | ORAL | Status: DC
  Administered 2016-07-23: 360 mg via ORAL
  Filled 2016-07-23 (×2): qty 1

## 2016-07-23 MED ORDER — POLYETHYLENE GLYCOL 3350 17 G PO PACK
17.0000 g | PACK | Freq: Two times a day (BID) | ORAL | Status: DC
  Administered 2016-07-23: 17 g via ORAL
  Filled 2016-07-23: qty 1

## 2016-07-23 MED ORDER — MIDAZOLAM HCL 2 MG/2ML IJ SOLN
INTRAMUSCULAR | Status: AC
  Filled 2016-07-23: qty 2

## 2016-07-23 MED ORDER — ALUM & MAG HYDROXIDE-SIMETH 200-200-20 MG/5ML PO SUSP: 30.0000 mL | ORAL | Status: DC | PRN

## 2016-07-23 MED ORDER — PROPOFOL 10 MG/ML IV BOLUS
INTRAVENOUS | Status: DC | PRN
  Administered 2016-07-23 (×2): 20 mg via INTRAVENOUS

## 2016-07-23 MED ORDER — HYDROMORPHONE HCL 1 MG/ML IJ SOLN: 0.2500 mg | INTRAMUSCULAR | Status: DC | PRN

## 2016-07-23 MED ORDER — KETOROLAC TROMETHAMINE 30 MG/ML IJ SOLN
INTRAMUSCULAR | Status: AC
  Filled 2016-07-23: qty 1

## 2016-07-23 MED ORDER — CELECOXIB 200 MG PO CAPS
200.0000 mg | ORAL_CAPSULE | Freq: Two times a day (BID) | ORAL | Status: DC
  Administered 2016-07-23 – 2016-07-24 (×2): 200 mg via ORAL
  Filled 2016-07-23 (×2): qty 1

## 2016-07-23 MED ORDER — BUPIVACAINE HCL (PF) 0.25 % IJ SOLN
INTRAMUSCULAR | Status: DC | PRN
  Administered 2016-07-23: 30 mL

## 2016-07-23 MED ORDER — HYDROCHLOROTHIAZIDE 25 MG PO TABS
25.0000 mg | ORAL_TABLET | Freq: Every day | ORAL | Status: DC
  Administered 2016-07-23: 25 mg via ORAL
  Filled 2016-07-23: qty 1

## 2016-07-23 MED ORDER — PROMETHAZINE HCL 25 MG/ML IJ SOLN: 6.2500 mg | INTRAMUSCULAR | Status: DC | PRN

## 2016-07-23 MED ORDER — FERROUS SULFATE 325 (65 FE) MG PO TABS
325.0000 mg | ORAL_TABLET | Freq: Three times a day (TID) | ORAL | Status: DC
  Administered 2016-07-23: 325 mg via ORAL
  Filled 2016-07-23: qty 1

## 2016-07-23 MED ORDER — LACTATED RINGERS IV SOLN
INTRAVENOUS | Status: DC
  Administered 2016-07-23: 1000 mL via INTRAVENOUS
  Administered 2016-07-23: 11:00:00 via INTRAVENOUS

## 2016-07-23 MED ORDER — POLYETHYLENE GLYCOL 3350 17 G PO PACK: 17.0000 g | PACK | Freq: Two times a day (BID) | ORAL | 0 refills | Status: DC

## 2016-07-23 MED ORDER — MAGNESIUM CITRATE PO SOLN: 1.0000 | Freq: Once | ORAL | Status: DC | PRN

## 2016-07-23 MED ORDER — MEPERIDINE HCL 50 MG/ML IJ SOLN: 6.2500 mg | INTRAMUSCULAR | Status: DC | PRN

## 2016-07-23 MED ORDER — ROPIVACAINE HCL 7.5 MG/ML IJ SOLN
INTRAMUSCULAR | Status: DC | PRN
  Administered 2016-07-23: 20 mL via PERINEURAL

## 2016-07-23 MED ORDER — DOCUSATE SODIUM 100 MG PO CAPS: 100.0000 mg | ORAL_CAPSULE | Freq: Two times a day (BID) | ORAL | 0 refills | Status: DC

## 2016-07-23 MED ORDER — ONDANSETRON HCL 4 MG/2ML IJ SOLN
INTRAMUSCULAR | Status: DC | PRN
  Administered 2016-07-23: 4 mg via INTRAVENOUS

## 2016-07-23 MED ORDER — HYDROCODONE-ACETAMINOPHEN 7.5-325 MG PO TABS: 1.0000 | ORAL_TABLET | ORAL | 0 refills | Status: DC | PRN

## 2016-07-23 MED ORDER — CHLORHEXIDINE GLUCONATE 4 % EX LIQD: 60.0000 mL | Freq: Once | CUTANEOUS | Status: DC

## 2016-07-23 MED ORDER — DEXAMETHASONE SODIUM PHOSPHATE 10 MG/ML IJ SOLN
INTRAMUSCULAR | Status: DC | PRN
  Administered 2016-07-23: 10 mg via INTRAVENOUS

## 2016-07-23 MED ORDER — TRANEXAMIC ACID 1000 MG/10ML IV SOLN
1000.0000 mg | INTRAVENOUS | Status: AC
  Administered 2016-07-23: 1000 mg via INTRAVENOUS
  Filled 2016-07-23: qty 1100

## 2016-07-23 MED ORDER — HYDROCODONE-ACETAMINOPHEN 7.5-325 MG PO TABS
1.0000 | ORAL_TABLET | ORAL | Status: DC
  Administered 2016-07-23: 2 via ORAL
  Filled 2016-07-23 (×2): qty 2

## 2016-07-23 SURGICAL SUPPLY — 38 items
BAG DECANTER FOR FLEXI CONT (MISCELLANEOUS) IMPLANT
BAG ZIPLOCK 12X15 (MISCELLANEOUS) IMPLANT
BANDAGE ACE 6X5 VEL STRL LF (GAUZE/BANDAGES/DRESSINGS) ×2 IMPLANT
BLADE SAW RECIPROCATING 77.5 (BLADE) ×2 IMPLANT
BLADE SAW SGTL 13.0X1.19X90.0M (BLADE) ×2 IMPLANT
BOWL SMART MIX CTS (DISPOSABLE) ×2 IMPLANT
CAPT KNEE PARTIAL 2 ×2 IMPLANT
CEMENT HV SMART SET (Cement) ×2 IMPLANT
CLOTH BEACON ORANGE TIMEOUT ST (SAFETY) ×2 IMPLANT
CUFF TOURN SGL QUICK 34 (TOURNIQUET CUFF) ×1
CUFF TRNQT CYL 34X4X40X1 (TOURNIQUET CUFF) ×1 IMPLANT
DERMABOND ADVANCED (GAUZE/BANDAGES/DRESSINGS) ×1
DERMABOND ADVANCED .7 DNX12 (GAUZE/BANDAGES/DRESSINGS) ×1 IMPLANT
DRAPE U-SHAPE 47X51 STRL (DRAPES) ×2 IMPLANT
DRSG AQUACEL AG ADV 3.5X10 (GAUZE/BANDAGES/DRESSINGS) ×2 IMPLANT
DURAPREP 26ML APPLICATOR (WOUND CARE) ×4 IMPLANT
ELECT REM PT RETURN 9FT ADLT (ELECTROSURGICAL) ×2
ELECTRODE REM PT RTRN 9FT ADLT (ELECTROSURGICAL) ×1 IMPLANT
GLOVE BIOGEL M 7.0 STRL (GLOVE) IMPLANT
GLOVE BIOGEL PI IND STRL 7.5 (GLOVE) ×1 IMPLANT
GLOVE BIOGEL PI IND STRL 8.5 (GLOVE) ×1 IMPLANT
GLOVE BIOGEL PI INDICATOR 7.5 (GLOVE) ×1
GLOVE BIOGEL PI INDICATOR 8.5 (GLOVE) ×1
GLOVE ECLIPSE 8.0 STRL XLNG CF (GLOVE) ×2 IMPLANT
GLOVE ORTHO TXT STRL SZ7.5 (GLOVE) ×4 IMPLANT
GOWN STRL REUS W/TWL LRG LVL3 (GOWN DISPOSABLE) ×2 IMPLANT
GOWN STRL REUS W/TWL XL LVL3 (GOWN DISPOSABLE) ×2 IMPLANT
LEGGING LITHOTOMY PAIR STRL (DRAPES) ×2 IMPLANT
MANIFOLD NEPTUNE II (INSTRUMENTS) ×2 IMPLANT
PACK TOTAL KNEE CUSTOM (KITS) ×2 IMPLANT
SUT MNCRL AB 4-0 PS2 18 (SUTURE) ×2 IMPLANT
SUT VIC AB 1 CT1 36 (SUTURE) ×2 IMPLANT
SUT VIC AB 2-0 CT1 27 (SUTURE) ×2
SUT VIC AB 2-0 CT1 TAPERPNT 27 (SUTURE) ×2 IMPLANT
SUT VLOC 180 0 24IN GS25 (SUTURE) ×2 IMPLANT
SYR 50ML LL SCALE MARK (SYRINGE) ×2 IMPLANT
TRAY FOLEY W/METER SILVER 16FR (SET/KITS/TRAYS/PACK) IMPLANT
WRAP KNEE MAXI GEL POST OP (GAUZE/BANDAGES/DRESSINGS) ×2 IMPLANT

## 2016-07-23 NOTE — Op Note (Signed)
NAME: Garrett Hogan    MEDICAL RECORD NO.: [id number]   FACILITY[facility]   DATE OF BIRTH: 05-26-1946  PHYSICIAN: Madlyn FrankelMatthew D. Charlann Boxerlin, M.D.    DATE OF PROCEDURE: 07/23/2016    OPERATIVE REPORT   PREOPERATIVE DIAGNOSIS: Left knee medial compartment osteoarthritis.   POSTOPERATIVE DIAGNOSIS: Left knee medial compartment osteoarthritis.  PROCEDURE: Left partial knee replacement utilizing Biomet Oxford knee  component, size medium femur, a left medial size C tibial tray with a size 4 insert.   SURGEON: Madlyn FrankelMatthew D. Charlann Boxerlin, M.D.   ASSISTANT: Lanney GinsMatthew Babish, PAC.  Please note that Mr. Carmon SailsBabish was present for the entirety of the case,  utilized for preoperative positioning, perioperative retractor  management, general facilitation of the case and primary wound closure.   ANESTHESIA: Spinal.   SPECIMENS: None.   COMPLICATIONS: None.  DRAINS: None   TOURNIQUET TIME: 35 minutes at 250 mmHg.   INDICATIONS FOR PROCEDURE: The patient is a 71 y.o. patient of mine who presented for evaluation of left knee pain.  They presented with primary complaints of pain on the medial side of their knee. Radiographs revealed advanced medial compartment arthritis with specifically an antero-medial wear pattern.  There was bone on bone changes noted with subchondral sclerosis and osteophytes present. The patient has had progressive problems failing to respond to conservative measures of medications, injections and activity modification. Risks of infection, DVT, component failure, need for future revision surgery were all discussed and reviewed.  Consent was obtained for benefit of pain relief.  He had his right partial knee replaced recently and has done very well with it.  PROCEDURE IN DETAIL: The patient was brought to the operative theater.  Once adequate anesthesia, preoperative antibiotics, 2 gm of  Ancef, 1 gm of Tranexamic Acid, and 10 mg of Decadron administered, the patient was positioned in supine  position with a left thigh tourniquet  placed. The left lower extremity was prepped and draped in sterile  fashion with the leg on the Oxford leg holder.  The leg was allowed to flex to 120 degrees. A time-out  was performed identifying the patient, planned procedure, and extremity.  The leg was exsanguinated, tourniquet elevated to 250 mmHg. A midline  incision was made from the proximal pole of the patella to the tibial tubercle. A  soft tissue plane was created and partial median arthrotomy was then  made to allow for subluxation of the patella. Following initial synovectomy and  debridement, the osteophytes were removed off the medial aspect of the  knee.   Attention was first directed to the tibia. I sized the femur to a medium.  With the medium spoon in place the tibial  extramedullary guide was positioned over the anterior crest of the tibia  and pinned into position, and using a measured resection guide from the  Oxford system, a 4 mm resection was made off the proximal tibia. First  the reciprocating saw along the medial aspect of the tibial spines, then the oscillating saw.    At this point, I sized this cut surface seem to be best fit for a size C tibial tray.  With the retractors out of the wound and the knee held at 90 degrees the size 4 feeler gauge had appropriate tension on the medial ligament.   At this point, the femoral canal was opened with a drill and the  intramedullary rod passed. Then using the guide for a medium resection off  the posterior aspect of the femur was positioned over  the mid portion of the medial femoral condyle.  The orientation was set using the guide that mates the femoral guide to the intramedullary rod.  The 2 drill holes were made into the distal femur.  The posterior guide was then impacted into place and the posterior  femoral cut made.  At this point, I milled the distal femur with a size 5 spigot in place. At this point, we did a trial  reduction of the medium femur, size left C tibial tray and the size 4 feeler gauge. At 90 degrees of  flexion and at 20 degrees of flexion the knee had symmetric tension on  the ligaments.   Given these findings, the trial femoral component was removed. Final preparation of tibia was carried out by pinning it in position. Then  using a reciprocating saw I removed bone for the keel. Further bone was  removed with an osteotome.  Trial reduction was now carried out with the medium femur, the keeled size C tibia, and the size 4 lollipop insert. The balance of the  ligaments appeared to be symmetric at 20 degrees and 90 degrees. Given  all these findings, the trial components were removed.   Cement was mixed. The final components were opened. The knee was irrigated with  normal saline solution. Then final debridements of the  soft tissue was carried out, I also drilled the sclerotic bone with a drill.  The final components were cemented with a single batch of cement in a  two-stage technique with the tibial component cemented first. The knee  was then brought  to 45 degrees of flexion with a size 4 feeler gauge, held with pressure for a minute and half.  After this the femoral component was cemented in place.  The knee was again held at 45 degrees of flexion while the cement fully cured.  Excess cement was removed throughout the knee. Tourniquet was let down  after 35 minutes. After the cement had fully cured and excessive cement  was removed throughout the knee there was no visualized cement present.   The final size 4 left medium mm was chosen and snapped into position. We re-irrigated  the knee. The extensor mechanism  was then reapproximated using a #1 Vicryl and #0 V-lock sutures with the knee in flexion. The  remaining wound was closed with 2-0 Vicryl and a running 4-0 Monocryl.  The knee was cleaned, dried, and dressed sterilely using Dermabond and  Aquacel dressing. The patient  was  brought to the recovery room, Ace wrap in place, tolerating the  procedure well. He will be in the hospital for overnight observation.  We will initiate physical therapy and progress to ambulate.     Madlyn Frankel Charlann Boxer, M.D.

## 2016-07-23 NOTE — Anesthesia Procedure Notes (Signed)
Spinal  Patient location during procedure: OR End time: 07/23/2016 9:48 AM Staffing Anesthesiologist: Jairo BenJACKSON, Genever Hentges Performed: anesthesiologist  Preanesthetic Checklist Completed: patient identified, site marked, surgical consent, pre-op evaluation, timeout performed, IV checked, risks and benefits discussed and monitors and equipment checked Spinal Block Patient position: sitting Prep: ChloraPrep and site prepped and draped Patient monitoring: blood pressure, continuous pulse ox, cardiac monitor and heart rate Approach: midline Location: L3-4 Injection technique: single-shot Needle Needle type: Quincke  Needle gauge: 25 G Needle length: 9 cm Additional Notes Pt identified in Operating room.  Monitors applied. Working IV access confirmed. Sterile prep, drape lumbar spine.  1% lido local L 3,4.  #25ga Quincke into clear CSF L 3,4.  15mg  0.75% Bupivacaine with dextrose injected with asp CSF beginning and end of injection.  Patient asymptomatic, VSS, no heme aspirated, tolerated well.  Sandford Craze Kalina Morabito, MD

## 2016-07-23 NOTE — Anesthesia Postprocedure Evaluation (Signed)
Anesthesia Post Note  Patient: Garrett Hogan  Procedure(s) Performed: Procedure(s) (LRB): LEFT MEDIALLY UNICOMPARTMENTAL KNEE (Left)  Patient location during evaluation: PACU Anesthesia Type: Spinal and Regional Level of consciousness: awake and alert, oriented and patient cooperative Pain management: pain level controlled Vital Signs Assessment: post-procedure vital signs reviewed and stable Respiratory status: spontaneous breathing, nonlabored ventilation, respiratory function stable and patient connected to nasal cannula oxygen Cardiovascular status: blood pressure returned to baseline and stable Postop Assessment: spinal receding and no signs of nausea or vomiting Anesthetic complications: no       Last Vitals:  Vitals:   07/23/16 1345 07/23/16 1442  BP: 129/67 139/60  Pulse: 66 (!) 52  Resp: 15 15  Temp: 36.9 C 36.4 C    Last Pain:  Vitals:   07/23/16 1442  TempSrc: Oral  PainSc:                  Takara Sermons,E. Shaterica Mcclatchy

## 2016-07-23 NOTE — Discharge Instructions (Signed)

## 2016-07-23 NOTE — Interval H&P Note (Signed)
History and Physical Interval Note:  07/23/2016 8:47 AM  Garrett Hogan  has presented today for surgery, with the diagnosis of Left knee osteoarthritis  The various methods of treatment have been discussed with the patient and family. After consideration of risks, benefits and other options for treatment, the patient has consented to  Procedure(s): LEFT MEDIALLY UNICOMPARTMENTAL KNEE (Left) as a surgical intervention .  The patient's history has been reviewed, patient examined, no change in status, stable for surgery.  I have reviewed the patient's chart and labs.  Questions were answered to the patient's satisfaction.     [name],[name]

## 2016-07-23 NOTE — Anesthesia Procedure Notes (Signed)
Procedure Name: MAC Date/Time: 07/23/2016 9:46 AM Performed by: Dione Booze Pre-anesthesia Checklist: Patient identified, Emergency Drugs available, Suction available and Patient being monitored Patient Re-evaluated:Patient Re-evaluated prior to inductionOxygen Delivery Method: Simple face mask Placement Confirmation: positive ETCO2

## 2016-07-23 NOTE — Evaluation (Signed)
Physical Therapy Evaluation Patient Details Name: Garrett Hogan MRN: [id number] DOB: 01-Jun-1946 Today's Date: 07/23/2016   History of Present Illness  s/p LUKA, with a successful s/p RUKA about 1 year ago.   Clinical Impression  Pt is s/p LUKA resulting in the deficits listed below (see PT Problem List).Pt will benefit from acute PT to increase their independence and safety with mobility to allow discharge home safely with wife assisting as needed. .      Follow Up Recommendations Outpatient PT (pt is scheduling his OPPT )    Equipment Recommendations  None recommended by PT    Recommendations for Other Services       Precautions / Restrictions Precautions Precautions: Knee Restrictions Weight Bearing Restrictions: No      Mobility  Bed Mobility Overal bed mobility: Independent                Transfers Overall transfer level: Needs assistance Equipment used: Rolling walker (2 wheeled) Transfers: Sit to/from Stand Sit to Stand: Supervision         General transfer comment: performed nicely   Ambulation/Gait Ambulation/Gait assistance: Min guard Ambulation Distance (Feet): 150 Feet Assistive device: Rolling walker (2 wheeled) Gait Pattern/deviations: Step-through pattern     General Gait Details: continued to educate for him to take step to pattern due to numbness still a little in his gluteal area and noticable with decreased control in stance phase for step through. Also a small buckle 1x.   Stairs            Wheelchair Mobility    Modified Rankin (Stroke Patients Only)       Balance                                             Pertinent Vitals/Pain Pain Assessment: 0-10 Pain Score: 2  Pain Location: R knee  Pain Descriptors / Indicators: Sore Pain Intervention(s): Monitored during session;Ice applied    Home Living Family/patient expects to be discharged to:: Private residence Living Arrangements:  Spouse/significant other Available Help at Discharge: Family Type of Home: House Home Access: Stairs to enter Entrance Stairs-Rails: None Secretary/administrator of Steps: 3 Home Layout: Two level Home Equipment: Environmental consultant - 2 wheels;Crutches      Prior Function Level of Independence: Independent               Hand Dominance        Extremity/Trunk Assessment        Lower Extremity Assessment Lower Extremity Assessment: Overall WFL for tasks assessed (pt had some numbness still in glut area noticable with walking )       Communication   Communication: No difficulties  Cognition Arousal/Alertness: Awake/alert Behavior During Therapy: WFL for tasks assessed/performed Overall Cognitive Status: Within Functional Limits for tasks assessed                      General Comments      Exercises Total Joint Exercises Quad Sets: AROM;Left;Supine;5 reps Heel Slides: AROM;Supine;5 reps;Left Straight Leg Raises: AROM;Supine;5 reps Long Arc Quad: Seated;AAROM;10 reps Goniometric ROM: 0-85   Assessment/Plan    PT Assessment Patient needs continued PT services  PT Problem List Decreased strength;Decreased range of motion;Decreased activity tolerance;Decreased mobility;Decreased knowledge of precautions       PT Treatment Interventions Gait training;Stair training;Functional mobility training;Therapeutic activities;Therapeutic exercise;Patient/family education  PT Goals (Current goals can be found in the Care Plan section)  Acute Rehab PT Goals Patient Stated Goal: I am glad to see I am doing well  PT Goal Formulation: With patient Time For Goal Achievement: 07/30/16 Potential to Achieve Goals: Good    Frequency 7X/week   Barriers to discharge        Co-evaluation               End of Session Equipment Utilized During Treatment: Gait belt Activity Tolerance: Patient tolerated treatment well Patient left: in bed;with call bell/phone within  reach;with bed alarm set Nurse Communication: Mobility status PT Visit Diagnosis: Unsteadiness on feet (R26.81)    Functional Assessment Tool Used: AM-PAC 6 Clicks Basic Mobility;Clinical judgement Functional Limitation: Mobility: Walking and moving around Mobility: Walking and Moving Around Current Status (U9811(G8978): At least 1 percent but less than 20 percent impaired, limited or restricted Mobility: Walking and Moving Around Goal Status [phone](G8979): 0 percent impaired, limited or restricted    Time: 1555-1615 PT Time Calculation (min) (ACUTE ONLY): 20 min   Charges:   PT Evaluation $PT Eval Low Complexity: 1 Procedure     PT G Codes:   PT G-Codes **NOT FOR INPATIENT CLASS** Functional Assessment Tool Used: AM-PAC 6 Clicks Basic Mobility;Clinical judgement Functional Limitation: Mobility: Walking and moving around Mobility: Walking and Moving Around Current Status (G9562(G8978): At least 1 percent but less than 20 percent impaired, limited or restricted Mobility: Walking and Moving Around Goal Status [phone](G8979): 0 percent impaired, limited or restricted     Greenleigh Kauth 07/23/2016, 5:18 PM  Marella BileSharron Amora Sheehy, PT Pager: [phone] 07/23/2016

## 2016-07-23 NOTE — Progress Notes (Signed)
Assisted Dr. C. Jackson with left, ultrasound guided, adductor canal block. Side rails up, monitors on throughout procedure. See vital signs in flow sheet. Tolerated Procedure well.  

## 2016-07-23 NOTE — Anesthesia Preprocedure Evaluation (Addendum)
Anesthesia Evaluation  Patient identified by MRN, date of birth, ID band Patient awake    Reviewed: Allergy & Precautions, NPO status , Patient's Chart, lab work & pertinent test results  History of Anesthesia Complications Negative for: history of anesthetic complications  Airway Mallampati: I  TM Distance: >3 FB Neck ROM: Full    Dental  (+) Dental Advisory Given   Pulmonary former smoker,    breath sounds clear to auscultation       Cardiovascular hypertension, Pt. 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) MR  Rhythm:Regular Rate:Normal  '16 ECHO: EF 60-65%, mild-mod MR '14 stress: EF 55%, normal perfusion at rest and stress   Neuro/Psych negative neurological ROS     GI/Hepatic GERD  Controlled,(+) Hepatitis -, C  Endo/Other  Morbid obesity  Renal/GU Renal InsufficiencyRenal disease (creat 1.54)     Musculoskeletal  (+) Arthritis , Osteoarthritis,    Abdominal (+) + obese,   Peds  Hematology   Anesthesia Other Findings   Reproductive/Obstetrics                            Anesthesia Physical Anesthesia Plan  ASA: II  Anesthesia Plan: Spinal   Post-op Pain Management:  Regional for Post-op pain   Induction:   Airway Management Planned: Natural Airway and Simple Face Mask  Additional Equipment:   Intra-op Plan:   Post-operative Plan:   Informed Consent: I have reviewed the patients History and Physical, chart, labs and discussed the procedure including the risks, benefits and alternatives for the proposed anesthesia with the patient or authorized representative who has indicated his/her understanding and acceptance.   Dental advisory given  Plan Discussed with: CRNA and Surgeon  Anesthesia Plan Comments: (Plan routine monitors, SAB with adductor canal block for post op analgesia)       Anesthesia Quick Evaluation

## 2016-07-23 NOTE — Transfer of Care (Signed)
Immediate Anesthesia Transfer of Care Note  Patient: Garrett Hogan  Procedure(s) Performed: Procedure(s): LEFT MEDIALLY UNICOMPARTMENTAL KNEE (Left)  Patient Location: PACU  Anesthesia Type:MAC combined with regional for post-op pain  Level of Consciousness:  sedated, patient cooperative and responds to stimulation  Airway & Oxygen Therapy:Patient Spontanous Breathing and Patient connected to face mask oxgen  Post-op Assessment:  Report given to PACU RN and Post -op Vital signs reviewed and stable  Post vital signs:  Reviewed and stable  Last Vitals:  Vitals:   07/23/16 0931 07/23/16 0932  BP:  126/67  Pulse: (!) 57 (!) 58  Resp: 15 11  Temp:      Complications: No apparent anesthesia complications

## 2016-07-23 NOTE — Anesthesia Procedure Notes (Addendum)
Anesthesia Regional Block: Adductor canal block   Pre-Anesthetic Checklist: ,, timeout performed, Correct Patient, Correct Site, Correct Laterality, Correct Procedure, Correct Position, site marked, Risks and benefits discussed,  Surgical consent,  Pre-op evaluation,  At surgeon's request and post-op pain management  Laterality: Left and Lower  Prep: chloraprep       Needles:  Injection technique: Single-shot  Needle Type: Echogenic Needle     Needle Length: 9cm  Needle Gauge: 21     Additional Needles:   Procedures: ultrasound guided,,,,,,,,  Narrative:  Start time: 07/23/2016 9:18 AM End time: 07/23/2016 9:24 AM Injection made incrementally with aspirations every 5 mL.  Performed by: Personally  Anesthesiologist: Jean RosenthalJACKSON, Shaunna Rosetti  Additional Notes: Pt identified in Holding room.  Monitors applied. Working IV access confirmed. Sterile prep L thigh.  #21ga ECHOgenic needle into adductor canal with US guidance.  20cc 0.75% Ropivacaine injected incrementally after negative test dose.  Patient asymptomatic, VSS, tolerated well.  Sandford Craze Dayami Taitt, MD

## 2016-07-24 DIAGNOSIS — N183 Chronic kidney disease, stage 3 (moderate): Secondary | ICD-10-CM | POA: Diagnosis not present

## 2016-07-24 DIAGNOSIS — M25762 Osteophyte, left knee: Secondary | ICD-10-CM | POA: Diagnosis not present

## 2016-07-24 DIAGNOSIS — N400 Enlarged prostate without lower urinary tract symptoms: Secondary | ICD-10-CM | POA: Diagnosis not present

## 2016-07-24 DIAGNOSIS — E669 Obesity, unspecified: Secondary | ICD-10-CM | POA: Diagnosis present

## 2016-07-24 DIAGNOSIS — M1712 Unilateral primary osteoarthritis, left knee: Secondary | ICD-10-CM | POA: Diagnosis not present

## 2016-07-24 DIAGNOSIS — I129 Hypertensive chronic kidney disease with stage 1 through stage 4 chronic kidney disease, or unspecified chronic kidney disease: Secondary | ICD-10-CM | POA: Diagnosis not present

## 2016-07-24 DIAGNOSIS — I2510 Atherosclerotic heart disease of native coronary artery without angina pectoris: Secondary | ICD-10-CM | POA: Diagnosis not present

## 2016-07-24 LAB — BASIC METABOLIC PANEL
ANION GAP: 7 (ref 5–15)
BUN: 27 mg/dL — ABNORMAL HIGH (ref 6–20)
CHLORIDE: 108 mmol/L (ref 101–111)
CO2: 24 mmol/L (ref 22–32)
Calcium: 8.9 mg/dL (ref 8.9–10.3)
Creatinine, Ser: 1.42 mg/dL — ABNORMAL HIGH (ref 0.61–1.24)
GFR calc non Af Amer: 49 mL/min — ABNORMAL LOW (ref >60)
GFR, EST AFRICAN AMERICAN: 56 mL/min — AB (ref >60)
Glucose, Bld: 122 mg/dL — ABNORMAL HIGH (ref 65–99)
Potassium: 3.9 mmol/L (ref 3.5–5.1)
Sodium: 139 mmol/L (ref 135–145)

## 2016-07-24 LAB — CBC
HEMATOCRIT: 36.2 % — AB (ref 39.0–52.0)
HEMOGLOBIN: 11.8 g/dL — AB (ref 13.0–17.0)
MCH: 30.4 pg (ref 26.0–34.0)
MCHC: 32.6 g/dL (ref 30.0–36.0)
MCV: 93.3 fL (ref 78.0–100.0)
Platelets: 190 10*3/uL (ref 150–400)
RBC: 3.88 MIL/uL — AB (ref 4.22–5.81)
RDW: 14.1 % (ref 11.5–15.5)
WBC: 13.3 10*3/uL — AB (ref 4.0–10.5)

## 2016-07-24 MED ORDER — ASPIRIN 81 MG PO CHEW: 81.0000 mg | CHEWABLE_TABLET | Freq: Two times a day (BID) | ORAL | 0 refills | Status: AC

## 2016-07-24 MED ORDER — CYCLOBENZAPRINE HCL 10 MG PO TABS: 10.0000 mg | ORAL_TABLET | Freq: Three times a day (TID) | ORAL | 0 refills | Status: DC | PRN

## 2016-07-24 NOTE — Progress Notes (Signed)
Physical Therapy Treatment Patient Details Name: Garrett Hogan MRN: [id number] DOB: 1946/02/25 Today's Date: 07/24/2016    History of Present Illness s/p LUKA, with a successful s/p RUKA about 1 year ago.     PT Comments    POD # 1  Assisted OOB to amb in hallway. Practice 2 steps then performed all supine Uni KR TE's following HEP handout.  Instructed on proper tech, freq as well as use of ICE.   Follow Up Recommendations  Outpatient PT       Equipment Recommendations  None recommended by PT    Recommendations for Other Services       Precautions / Restrictions Precautions Precautions: Knee Restrictions Weight Bearing Restrictions: No    Mobility  Bed Mobility Overal bed mobility: Modified Independent             General bed mobility comments: increased time  Transfers Overall transfer level: Needs assistance Equipment used: Rolling walker (2 wheeled) Transfers: Sit to/from Stand Sit to Stand: Supervision         General transfer comment: one initial VC safety with turns using walkwer in tight space  Ambulation/Gait Ambulation/Gait assistance: Min guard;Supervision Ambulation Distance (Feet): 156 Feet Assistive device: Rolling walker (2 wheeled) Gait Pattern/deviations: Step-through pattern;Decreased stance time - left Gait velocity: WFL   General Gait Details: 25% VC's on safety with turns and backward gait   Stairs Stairs: Yes   Stair Management: No rails;Step to pattern Number of Stairs: 2 General stair comments: 25% VC's on proper sequencing and safety  Wheelchair Mobility    Modified Rankin (Stroke Patients Only)       Balance                                    Cognition Arousal/Alertness: Awake/alert Behavior During Therapy: WFL for tasks assessed/performed Overall Cognitive Status: Within Functional Limits for tasks assessed                      Exercises   Uni  Knee Replacement TE's 10 reps  B LE ankle pumps 10 reps towel squeezes 10 reps knee presses 10 reps heel slides  10 reps SAQ's 10 reps SLR's 10 reps ABD Followed by ICE    General Comments        Pertinent Vitals/Pain Pain Assessment: 0-10 Pain Score: 2  Pain Location: R knee  Pain Descriptors / Indicators: Sore;Tender Pain Intervention(s): Monitored during session;Repositioned;Ice applied    Home Living                      Prior Function            PT Goals (current goals can now be found in the care plan section) Progress towards PT goals: Progressing toward goals    Frequency    7X/week      PT Plan Current plan remains appropriate    Co-evaluation             End of Session Equipment Utilized During Treatment: Gait belt Activity Tolerance: Patient tolerated treatment well Patient left: in bed;with call bell/phone within reach Nurse Communication:  (Pt ready for D/C to home) PT Visit Diagnosis: Unsteadiness on feet (R26.81)     Time: 4098-1191 PT Time Calculation (min) (ACUTE ONLY): 26 min  Charges:  $Gait Training: 8-22 mins $Therapeutic Activity: 8-22 mins  G Codes:       Rica Koyanagi  PTA [facility]  Acute  Rehab Pager      [phone]

## 2016-07-24 NOTE — Progress Notes (Signed)
OT Cancellation Note  Patient Details Name: Garrett Hogan MRN: [id number] DOB: Dec 09, 1945   Cancelled Treatment:    Reason Eval/Treat Not Completed: OT screened, no needs identified, will sign off.  Pt had other UKR done last year and feels comfortable with adls and bathroom transfers. Reviewed tub readiness.  Garrett Hogan 07/24/2016, 11:08 AM  Marica OtterMaryellen Haileyann Staiger, OTR/L [phone] 07/24/2016

## 2016-07-24 NOTE — Care Management Note (Signed)
Case Management Note  Patient Details  Name: Garrett Hogan MRN: [id number] Date of Birth: 1946-05-28  Subjective/Objective:                  LEFT MEDIALLY UNICOMPARTMENTAL KNEE (Left)  Action/Plan: Discharge planning Expected Discharge Date:  07/24/16               Expected Discharge Plan:  [place]  In-House Referral:  NA  Discharge planning Services  CM Consult  Post Acute Care Choice:  Home Health Choice offered to:  Patient  DME Arranged:  N/A DME Agency:  NA  HH Arranged:  PT [place] Agency:  [place]  Status of Service:  Completed, signed off  If discussed at [place] of Stay Meetings, dates discussed:    Additional Comments: CM met with pt in room to offer choice of home health agency. Pt chooses [facility] to render HHPT. Referral called to [facility] rep, Joelene Millin. Pt states he has both a rolling walker (in room) and a 3n1.  No other CM needs were communicated. Dellie Catholic, RN 07/24/2016, 10:51 AM

## 2016-07-24 NOTE — Progress Notes (Signed)
     Subjective: 1 Day Post-Op Procedure(s) (LRB): LEFT MEDIALLY UNICOMPARTMENTAL KNEE (Left)   Patient reports pain as mild / none.  Pain very well controlled, he hasn't needed any real analgesic medication. He is very pleased with how he is progressing.  Ready to be discharged home.   Objective:   VITALS:   Vitals:   07/24/16 0636 07/24/16 0916  BP: (!) 145/71 (!) 147/82  Pulse: 76 83  Resp: 15 16  Temp: 98.2 F (36.8 C) 98.4 F (36.9 C)    Dorsiflexion/Plantar flexion intact Incision: dressing C/D/I No cellulitis present Compartment soft  LABS  Recent Labs  07/24/16 0445  HGB 11.8*  HCT 36.2*  WBC 13.3*  PLT 190     Recent Labs  07/24/16 0445  NA 139  K 3.9  BUN 27*  CREATININE 1.42*  GLUCOSE 122*     Assessment/Plan: 1 Day Post-Op Procedure(s) (LRB): LEFT MEDIALLY UNICOMPARTMENTAL KNEE (Left) Foley cath d/c'ed Advance diet Up with therapy D/C IV fluids Discharge home [facility]. Follow up with OLIN,Kathryne Ramella D in 2 weeks.  Contact information:  [facility] [address] [place] [id number] [phone]    Obese (BMI 30-39.9) Estimated body mass index is 30.4 kg/m as calculated from the following:   Height as of this encounter: 6' 2.5" (1.892 m).   Weight as of this encounter: 108.9 kg (240 lb). Patient also counseled that weight may inhibit the healing process Patient counseled that losing weight will help with future health issues      Anastasio AuerbachMatthew S. Ayuub Penley   PAC  07/24/2016, 10:00 AM

## 2016-07-25 DIAGNOSIS — N183 Chronic kidney disease, stage 3 (moderate): Secondary | ICD-10-CM | POA: Diagnosis not present

## 2016-07-25 DIAGNOSIS — Z96652 Presence of left artificial knee joint: Secondary | ICD-10-CM | POA: Diagnosis not present

## 2016-07-25 DIAGNOSIS — I2510 Atherosclerotic heart disease of native coronary artery without angina pectoris: Secondary | ICD-10-CM | POA: Diagnosis not present

## 2016-07-25 DIAGNOSIS — I129 Hypertensive chronic kidney disease with stage 1 through stage 4 chronic kidney disease, or unspecified chronic kidney disease: Secondary | ICD-10-CM | POA: Diagnosis not present

## 2016-07-25 DIAGNOSIS — Z96651 Presence of right artificial knee joint: Secondary | ICD-10-CM | POA: Diagnosis not present

## 2016-07-25 DIAGNOSIS — Z471 Aftercare following joint replacement surgery: Secondary | ICD-10-CM | POA: Diagnosis not present

## 2016-07-25 DIAGNOSIS — E669 Obesity, unspecified: Secondary | ICD-10-CM | POA: Diagnosis not present

## 2016-07-25 DIAGNOSIS — M1991 Primary osteoarthritis, unspecified site: Secondary | ICD-10-CM | POA: Diagnosis not present

## 2016-07-26 NOTE — Discharge Summary (Signed)
Physician Discharge Summary  Patient ID: Garrett CroonCecil D Newson MRN: [id number] DOB/AGE: 04-Apr-1946 71 y.o.  Admit date: 07/23/2016 Discharge date: 07/24/2016   Procedures:  Procedure(s) (LRB): LEFT MEDIALLY UNICOMPARTMENTAL KNEE (Left)  Attending Physician:  Dr. 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 Arthritis   . BPH (benign prostatic hyperplasia)   . CAD (coronary artery disease)   . CKD (chronic kidney disease), stage III   . Coronary artery stenosis   . GERD (gastroesophageal reflux disease)   . Headache   . Hepatitis    hx of Hep C - 1970s   . Hypercholesteremia   . Hypertension   . LVH (left ventricular hypertrophy)   . Murmur, cardiac     HPI:    Garrett Hogan, 71 y.o. male male, has a history of pain and functional disability in the left and has failed non-surgical conservative treatments for greater than 12 weeks to include NSAID's and/or analgesics, corticosteriod injections, use of assistive devices and activity modification.  Onset of symptoms was gradual, starting  years ago with gradually worsening course since that time. The patient noted prior procedures on the knee to include  unicompartmental arthroplasty on the right knee(s).  Patient currently rates pain in the left knee(s) at 7 out of 10 with activity. Patient has night pain, worsening of pain with activity and weight bearing, pain that interferes with activities of daily living, pain with passive range of motion, crepitus and joint swelling.  Patient has evidence of periarticular osteophytes and joint space narrowing of the medial compartment by imaging studies.  There is no active infection.  Risks, benefits and expectations were discussed with the patient.  Risks including but not limited to the risk of anesthesia, blood clots,  nerve damage, blood vessel damage, failure of the prosthesis, infection and up to and including death.  Patient understand the risks, benefits and expectations and wishes to proceed with surgery.  PCP: Vonna KotykWATTERSON,PATRICK, PA-C   Discharged Condition: good  Hospital Course:  Patient underwent the above stated procedure on 07/23/2016. Patient tolerated the procedure well and brought to the recovery room in good condition and subsequently to the floor.  POD #1 BP: 147/82 ; Pulse: 83 ; Temp: 98.4 F (36.9 C) ; Resp: 16 Patient reports pain as mild / none.  Pain very well controlled, he hasn't needed any real analgesic medication. He is very pleased with how he is progressing.  Ready to be discharged home.  Dorsiflexion/plantar flexion intact, incision: dressing C/D/I, no cellulitis present and compartment soft.   LABS  Basename    HGB     11.8  HCT     36.2    Discharge Exam: General appearance: alert, cooperative and no distress Extremities: Homans sign is negative, no sign of DVT, no edema, redness or tenderness in the calves or thighs and no ulcers, gangrene or trophic changes  Disposition: Home with follow up in 2 weeks   Follow-up Information    [name],Ada Holness D, MD. Schedule an appointment as soon as possible for a visit in 2 week(s).   Specialty:  Orthopedic Surgery Contact information: [address] [place] [id number] [phone]        [facility] Follow up.   Why:  home health physical therapy Contact information: [address]  [place] [address] [phone]           Discharge Instructions    Call MD / Call 911    Complete by:  As directed    If you experience chest pain or shortness of breath, CALL 911 and be transported to the hospital emergency room.  If you develope a fever above 101 F, pus (white drainage) or increased drainage or redness at the wound, or calf pain, call your surgeon's office.   Change  dressing    Complete by:  As directed    Maintain surgical dressing until follow up in the clinic. If the edges start to pull up, may reinforce with tape. If the dressing is no longer working, may remove and cover with gauze and tape, but must keep the area dry and clean.  Call with any questions or concerns.   Constipation Prevention    Complete by:  As directed    Drink plenty of fluids.  Prune juice may be helpful.  You may use a stool softener, such as Colace (over the counter) 100 mg twice a day.  Use MiraLax (over the counter) for constipation as needed.   Diet - low sodium heart healthy    Complete by:  As directed    Discharge instructions    Complete by:  As directed    Maintain surgical dressing until follow up in the clinic. If the edges start to pull up, may reinforce with tape. If the dressing is no longer working, may remove and cover with gauze and tape, but must keep the area dry and clean.  Follow up in 2 weeks at [facility]. Call with any questions or concerns.   Increase activity slowly as tolerated    Complete by:  As directed    Weight bearing as tolerated with assist device (walker, cane, etc) as directed, use it as long as suggested by your surgeon or therapist, typically at least 4-6 weeks.   TED hose    Complete by:  As directed    Use stockings (TED hose) for 2 weeks on both leg(s).  You may remove them at night for sleeping.      Allergies as of 07/24/2016   No Known Allergies     Medication List    STOP taking these medications   meloxicam 15 MG tablet Commonly known as:  MOBIC     TAKE these medications   aspirin 81 MG chewable tablet Chew 1 tablet (81 mg total) by mouth 2 (two) times daily. Take for 4 weeks.   atorvastatin 10 MG tablet Commonly known as:  LIPITOR Take 10 mg by mouth at bedtime.   cloNIDine 0.2 MG tablet Commonly known as:  CATAPRES Take 0.2 mg by mouth 2 (two) times daily.   COQ10 PO Take 1 capsule by mouth at  bedtime.   cyclobenzaprine 10 MG tablet Commonly known as:  FLEXERIL Take 1 tablet (10 mg total) by mouth 3 (three) times daily as needed for muscle spasms.   diltiazem 360 MG 24 hr capsule Commonly known as:  TIAZAC Take 360 mg by mouth at bedtime.   docusate sodium 100 MG capsule Commonly known as:  COLACE Take 1 capsule (100 mg total) by mouth 2 (two) times daily.   ferrous sulfate 325 (65 FE) MG tablet Commonly known as:  FERROUSUL Take 1 tablet (325 mg total) by mouth 3 (three) times daily with meals.   hydrochlorothiazide 25 MG tablet Commonly known as:  HYDRODIURIL  Take 25 mg by mouth at bedtime.   polyethylene glycol packet Commonly known as:  MIRALAX / GLYCOLAX Take 17 g by mouth 2 (two) times daily.   sodium chloride 0.65 % Soln nasal spray Commonly known as:  OCEAN Place 1 spray into both nostrils 2 (two) times daily as needed for congestion.        Signed: Anastasio Auerbach. Jonasia Coiner   PA-C  07/26/2016, 2:48 PM

## 2016-07-27 DIAGNOSIS — Z471 Aftercare following joint replacement surgery: Secondary | ICD-10-CM | POA: Diagnosis not present

## 2016-07-27 DIAGNOSIS — Z96652 Presence of left artificial knee joint: Secondary | ICD-10-CM | POA: Diagnosis not present

## 2016-07-27 DIAGNOSIS — I129 Hypertensive chronic kidney disease with stage 1 through stage 4 chronic kidney disease, or unspecified chronic kidney disease: Secondary | ICD-10-CM | POA: Diagnosis not present

## 2016-07-27 DIAGNOSIS — E669 Obesity, unspecified: Secondary | ICD-10-CM | POA: Diagnosis not present

## 2016-07-27 DIAGNOSIS — I2510 Atherosclerotic heart disease of native coronary artery without angina pectoris: Secondary | ICD-10-CM | POA: Diagnosis not present

## 2016-07-27 DIAGNOSIS — N183 Chronic kidney disease, stage 3 (moderate): Secondary | ICD-10-CM | POA: Diagnosis not present

## 2016-07-29 DIAGNOSIS — E669 Obesity, unspecified: Secondary | ICD-10-CM | POA: Diagnosis not present

## 2016-07-29 DIAGNOSIS — I129 Hypertensive chronic kidney disease with stage 1 through stage 4 chronic kidney disease, or unspecified chronic kidney disease: Secondary | ICD-10-CM | POA: Diagnosis not present

## 2016-07-29 DIAGNOSIS — Z471 Aftercare following joint replacement surgery: Secondary | ICD-10-CM | POA: Diagnosis not present

## 2016-07-29 DIAGNOSIS — N183 Chronic kidney disease, stage 3 (moderate): Secondary | ICD-10-CM | POA: Diagnosis not present

## 2016-07-29 DIAGNOSIS — I2510 Atherosclerotic heart disease of native coronary artery without angina pectoris: Secondary | ICD-10-CM | POA: Diagnosis not present

## 2016-07-29 DIAGNOSIS — Z96652 Presence of left artificial knee joint: Secondary | ICD-10-CM | POA: Diagnosis not present

## 2016-07-31 DIAGNOSIS — Z96652 Presence of left artificial knee joint: Secondary | ICD-10-CM | POA: Diagnosis not present

## 2016-07-31 DIAGNOSIS — N183 Chronic kidney disease, stage 3 (moderate): Secondary | ICD-10-CM | POA: Diagnosis not present

## 2016-07-31 DIAGNOSIS — I129 Hypertensive chronic kidney disease with stage 1 through stage 4 chronic kidney disease, or unspecified chronic kidney disease: Secondary | ICD-10-CM | POA: Diagnosis not present

## 2016-07-31 DIAGNOSIS — I2510 Atherosclerotic heart disease of native coronary artery without angina pectoris: Secondary | ICD-10-CM | POA: Diagnosis not present

## 2016-07-31 DIAGNOSIS — Z471 Aftercare following joint replacement surgery: Secondary | ICD-10-CM | POA: Diagnosis not present

## 2016-07-31 DIAGNOSIS — E669 Obesity, unspecified: Secondary | ICD-10-CM | POA: Diagnosis not present

## 2016-08-02 DIAGNOSIS — Z471 Aftercare following joint replacement surgery: Secondary | ICD-10-CM | POA: Diagnosis not present

## 2016-08-02 DIAGNOSIS — I2510 Atherosclerotic heart disease of native coronary artery without angina pectoris: Secondary | ICD-10-CM | POA: Diagnosis not present

## 2016-08-02 DIAGNOSIS — E669 Obesity, unspecified: Secondary | ICD-10-CM | POA: Diagnosis not present

## 2016-08-02 DIAGNOSIS — Z96652 Presence of left artificial knee joint: Secondary | ICD-10-CM | POA: Diagnosis not present

## 2016-08-02 DIAGNOSIS — N183 Chronic kidney disease, stage 3 (moderate): Secondary | ICD-10-CM | POA: Diagnosis not present

## 2016-08-02 DIAGNOSIS — I129 Hypertensive chronic kidney disease with stage 1 through stage 4 chronic kidney disease, or unspecified chronic kidney disease: Secondary | ICD-10-CM | POA: Diagnosis not present

## 2016-08-05 DIAGNOSIS — I2510 Atherosclerotic heart disease of native coronary artery without angina pectoris: Secondary | ICD-10-CM | POA: Diagnosis not present

## 2016-08-05 DIAGNOSIS — E669 Obesity, unspecified: Secondary | ICD-10-CM | POA: Diagnosis not present

## 2016-08-05 DIAGNOSIS — Z96652 Presence of left artificial knee joint: Secondary | ICD-10-CM | POA: Diagnosis not present

## 2016-08-05 DIAGNOSIS — Z471 Aftercare following joint replacement surgery: Secondary | ICD-10-CM | POA: Diagnosis not present

## 2016-08-05 DIAGNOSIS — I129 Hypertensive chronic kidney disease with stage 1 through stage 4 chronic kidney disease, or unspecified chronic kidney disease: Secondary | ICD-10-CM | POA: Diagnosis not present

## 2016-08-05 DIAGNOSIS — N183 Chronic kidney disease, stage 3 (moderate): Secondary | ICD-10-CM | POA: Diagnosis not present

## 2016-08-07 DIAGNOSIS — Z96652 Presence of left artificial knee joint: Secondary | ICD-10-CM | POA: Diagnosis not present

## 2016-08-07 DIAGNOSIS — I2510 Atherosclerotic heart disease of native coronary artery without angina pectoris: Secondary | ICD-10-CM | POA: Diagnosis not present

## 2016-08-07 DIAGNOSIS — Z471 Aftercare following joint replacement surgery: Secondary | ICD-10-CM | POA: Diagnosis not present

## 2016-08-07 DIAGNOSIS — I129 Hypertensive chronic kidney disease with stage 1 through stage 4 chronic kidney disease, or unspecified chronic kidney disease: Secondary | ICD-10-CM | POA: Diagnosis not present

## 2016-08-07 DIAGNOSIS — E669 Obesity, unspecified: Secondary | ICD-10-CM | POA: Diagnosis not present

## 2016-08-07 DIAGNOSIS — N183 Chronic kidney disease, stage 3 (moderate): Secondary | ICD-10-CM | POA: Diagnosis not present

## 2016-08-08 DIAGNOSIS — Z471 Aftercare following joint replacement surgery: Secondary | ICD-10-CM | POA: Diagnosis not present

## 2016-08-08 DIAGNOSIS — Z96652 Presence of left artificial knee joint: Secondary | ICD-10-CM | POA: Diagnosis not present

## 2016-08-12 DIAGNOSIS — Z471 Aftercare following joint replacement surgery: Secondary | ICD-10-CM | POA: Diagnosis not present

## 2016-08-12 DIAGNOSIS — Z96652 Presence of left artificial knee joint: Secondary | ICD-10-CM | POA: Diagnosis not present

## 2016-08-12 DIAGNOSIS — M25562 Pain in left knee: Secondary | ICD-10-CM | POA: Diagnosis not present

## 2016-08-12 DIAGNOSIS — M1712 Unilateral primary osteoarthritis, left knee: Secondary | ICD-10-CM | POA: Diagnosis not present

## 2016-08-14 DIAGNOSIS — Z471 Aftercare following joint replacement surgery: Secondary | ICD-10-CM | POA: Diagnosis not present

## 2016-08-14 DIAGNOSIS — M1712 Unilateral primary osteoarthritis, left knee: Secondary | ICD-10-CM | POA: Diagnosis not present

## 2016-08-14 DIAGNOSIS — M25562 Pain in left knee: Secondary | ICD-10-CM | POA: Diagnosis not present

## 2016-08-14 DIAGNOSIS — Z96652 Presence of left artificial knee joint: Secondary | ICD-10-CM | POA: Diagnosis not present

## 2016-08-16 DIAGNOSIS — M25562 Pain in left knee: Secondary | ICD-10-CM | POA: Diagnosis not present

## 2016-08-16 DIAGNOSIS — Z96652 Presence of left artificial knee joint: Secondary | ICD-10-CM | POA: Diagnosis not present

## 2016-08-16 DIAGNOSIS — Z471 Aftercare following joint replacement surgery: Secondary | ICD-10-CM | POA: Diagnosis not present

## 2016-08-16 DIAGNOSIS — M1712 Unilateral primary osteoarthritis, left knee: Secondary | ICD-10-CM | POA: Diagnosis not present

## 2016-08-19 DIAGNOSIS — Z96652 Presence of left artificial knee joint: Secondary | ICD-10-CM | POA: Diagnosis not present

## 2016-08-19 DIAGNOSIS — Z471 Aftercare following joint replacement surgery: Secondary | ICD-10-CM | POA: Diagnosis not present

## 2016-08-19 DIAGNOSIS — M1712 Unilateral primary osteoarthritis, left knee: Secondary | ICD-10-CM | POA: Diagnosis not present

## 2016-08-19 DIAGNOSIS — M25562 Pain in left knee: Secondary | ICD-10-CM | POA: Diagnosis not present

## 2016-08-21 DIAGNOSIS — M25562 Pain in left knee: Secondary | ICD-10-CM | POA: Diagnosis not present

## 2016-08-21 DIAGNOSIS — Z471 Aftercare following joint replacement surgery: Secondary | ICD-10-CM | POA: Diagnosis not present

## 2016-08-21 DIAGNOSIS — Z96652 Presence of left artificial knee joint: Secondary | ICD-10-CM | POA: Diagnosis not present

## 2016-08-21 DIAGNOSIS — M1712 Unilateral primary osteoarthritis, left knee: Secondary | ICD-10-CM | POA: Diagnosis not present

## 2016-08-22 DIAGNOSIS — R739 Hyperglycemia, unspecified: Secondary | ICD-10-CM | POA: Diagnosis not present

## 2016-08-22 DIAGNOSIS — R634 Abnormal weight loss: Secondary | ICD-10-CM | POA: Diagnosis not present

## 2016-08-22 DIAGNOSIS — R5383 Other fatigue: Secondary | ICD-10-CM | POA: Diagnosis not present

## 2016-08-23 DIAGNOSIS — Z471 Aftercare following joint replacement surgery: Secondary | ICD-10-CM | POA: Diagnosis not present

## 2016-08-23 DIAGNOSIS — M1712 Unilateral primary osteoarthritis, left knee: Secondary | ICD-10-CM | POA: Diagnosis not present

## 2016-08-23 DIAGNOSIS — Z96652 Presence of left artificial knee joint: Secondary | ICD-10-CM | POA: Diagnosis not present

## 2016-08-23 DIAGNOSIS — M25562 Pain in left knee: Secondary | ICD-10-CM | POA: Diagnosis not present

## 2016-08-26 DIAGNOSIS — Z471 Aftercare following joint replacement surgery: Secondary | ICD-10-CM | POA: Diagnosis not present

## 2016-08-26 DIAGNOSIS — M1712 Unilateral primary osteoarthritis, left knee: Secondary | ICD-10-CM | POA: Diagnosis not present

## 2016-08-26 DIAGNOSIS — M25562 Pain in left knee: Secondary | ICD-10-CM | POA: Diagnosis not present

## 2016-08-26 DIAGNOSIS — Z96652 Presence of left artificial knee joint: Secondary | ICD-10-CM | POA: Diagnosis not present

## 2016-08-27 DIAGNOSIS — M25562 Pain in left knee: Secondary | ICD-10-CM | POA: Diagnosis not present

## 2016-08-27 DIAGNOSIS — M1712 Unilateral primary osteoarthritis, left knee: Secondary | ICD-10-CM | POA: Diagnosis not present

## 2016-08-27 DIAGNOSIS — Z471 Aftercare following joint replacement surgery: Secondary | ICD-10-CM | POA: Diagnosis not present

## 2016-08-27 DIAGNOSIS — Z96652 Presence of left artificial knee joint: Secondary | ICD-10-CM | POA: Diagnosis not present

## 2016-08-29 DIAGNOSIS — R634 Abnormal weight loss: Secondary | ICD-10-CM | POA: Diagnosis not present

## 2016-08-29 DIAGNOSIS — F329 Major depressive disorder, single episode, unspecified: Secondary | ICD-10-CM | POA: Diagnosis not present

## 2016-08-29 DIAGNOSIS — I10 Essential (primary) hypertension: Secondary | ICD-10-CM | POA: Diagnosis not present

## 2016-08-30 DIAGNOSIS — M25562 Pain in left knee: Secondary | ICD-10-CM | POA: Diagnosis not present

## 2016-08-30 DIAGNOSIS — Z96652 Presence of left artificial knee joint: Secondary | ICD-10-CM | POA: Diagnosis not present

## 2016-08-30 DIAGNOSIS — M1712 Unilateral primary osteoarthritis, left knee: Secondary | ICD-10-CM | POA: Diagnosis not present

## 2016-08-30 DIAGNOSIS — Z471 Aftercare following joint replacement surgery: Secondary | ICD-10-CM | POA: Diagnosis not present

## 2016-09-02 DIAGNOSIS — Z96652 Presence of left artificial knee joint: Secondary | ICD-10-CM | POA: Diagnosis not present

## 2016-09-02 DIAGNOSIS — Z471 Aftercare following joint replacement surgery: Secondary | ICD-10-CM | POA: Diagnosis not present

## 2016-09-02 DIAGNOSIS — M25562 Pain in left knee: Secondary | ICD-10-CM | POA: Diagnosis not present

## 2016-09-02 DIAGNOSIS — M1712 Unilateral primary osteoarthritis, left knee: Secondary | ICD-10-CM | POA: Diagnosis not present

## 2016-09-03 DIAGNOSIS — I350 Nonrheumatic aortic (valve) stenosis: Secondary | ICD-10-CM | POA: Diagnosis not present

## 2016-09-03 DIAGNOSIS — I517 Cardiomegaly: Secondary | ICD-10-CM | POA: Diagnosis not present

## 2016-09-03 DIAGNOSIS — I340 Nonrheumatic mitral (valve) insufficiency: Secondary | ICD-10-CM | POA: Diagnosis not present

## 2016-09-03 DIAGNOSIS — I10 Essential (primary) hypertension: Secondary | ICD-10-CM | POA: Diagnosis not present

## 2016-09-05 DIAGNOSIS — Z96652 Presence of left artificial knee joint: Secondary | ICD-10-CM | POA: Diagnosis not present

## 2016-09-05 DIAGNOSIS — Z471 Aftercare following joint replacement surgery: Secondary | ICD-10-CM | POA: Diagnosis not present

## 2016-09-06 DIAGNOSIS — R634 Abnormal weight loss: Secondary | ICD-10-CM | POA: Diagnosis not present

## 2016-09-06 DIAGNOSIS — R0602 Shortness of breath: Secondary | ICD-10-CM | POA: Diagnosis not present

## 2016-09-27 DIAGNOSIS — R634 Abnormal weight loss: Secondary | ICD-10-CM | POA: Diagnosis not present

## 2016-09-27 DIAGNOSIS — K760 Fatty (change of) liver, not elsewhere classified: Secondary | ICD-10-CM | POA: Diagnosis not present

## 2016-10-07 DIAGNOSIS — M79606 Pain in leg, unspecified: Secondary | ICD-10-CM | POA: Diagnosis not present

## 2016-10-10 DIAGNOSIS — S70312A Abrasion, left thigh, initial encounter: Secondary | ICD-10-CM | POA: Diagnosis not present

## 2016-10-10 DIAGNOSIS — M79652 Pain in left thigh: Secondary | ICD-10-CM | POA: Diagnosis not present

## 2016-10-10 DIAGNOSIS — Z23 Encounter for immunization: Secondary | ICD-10-CM | POA: Diagnosis not present

## 2016-10-17 DIAGNOSIS — Z471 Aftercare following joint replacement surgery: Secondary | ICD-10-CM | POA: Diagnosis not present

## 2016-10-17 DIAGNOSIS — Z96652 Presence of left artificial knee joint: Secondary | ICD-10-CM | POA: Diagnosis not present

## 2016-11-14 DIAGNOSIS — I10 Essential (primary) hypertension: Secondary | ICD-10-CM | POA: Diagnosis not present

## 2016-11-14 DIAGNOSIS — E7800 Pure hypercholesterolemia, unspecified: Secondary | ICD-10-CM | POA: Diagnosis not present

## 2016-11-14 DIAGNOSIS — M2550 Pain in unspecified joint: Secondary | ICD-10-CM | POA: Diagnosis not present

## 2016-11-14 DIAGNOSIS — Z471 Aftercare following joint replacement surgery: Secondary | ICD-10-CM | POA: Diagnosis not present

## 2016-11-14 DIAGNOSIS — Z96652 Presence of left artificial knee joint: Secondary | ICD-10-CM | POA: Diagnosis not present

## 2017-01-29 DIAGNOSIS — E7800 Pure hypercholesterolemia, unspecified: Secondary | ICD-10-CM | POA: Diagnosis not present

## 2017-01-29 DIAGNOSIS — I10 Essential (primary) hypertension: Secondary | ICD-10-CM | POA: Diagnosis not present

## 2017-01-29 DIAGNOSIS — M791 Myalgia: Secondary | ICD-10-CM | POA: Diagnosis not present

## 2017-01-29 DIAGNOSIS — N183 Chronic kidney disease, stage 3 (moderate): Secondary | ICD-10-CM | POA: Diagnosis not present

## 2017-02-27 DIAGNOSIS — Z6830 Body mass index (BMI) 30.0-30.9, adult: Secondary | ICD-10-CM | POA: Diagnosis not present

## 2017-02-27 DIAGNOSIS — E669 Obesity, unspecified: Secondary | ICD-10-CM | POA: Diagnosis not present

## 2017-02-27 DIAGNOSIS — M6281 Muscle weakness (generalized): Secondary | ICD-10-CM | POA: Diagnosis not present

## 2017-02-27 DIAGNOSIS — R269 Unspecified abnormalities of gait and mobility: Secondary | ICD-10-CM | POA: Diagnosis not present

## 2017-03-04 DIAGNOSIS — I6529 Occlusion and stenosis of unspecified carotid artery: Secondary | ICD-10-CM | POA: Diagnosis not present

## 2017-03-12 ENCOUNTER — Encounter: Payer: Self-pay | Admitting: Neurology

## 2017-04-10 DIAGNOSIS — R269 Unspecified abnormalities of gait and mobility: Secondary | ICD-10-CM | POA: Diagnosis not present

## 2017-04-10 DIAGNOSIS — Z6830 Body mass index (BMI) 30.0-30.9, adult: Secondary | ICD-10-CM | POA: Diagnosis not present

## 2017-04-10 DIAGNOSIS — E669 Obesity, unspecified: Secondary | ICD-10-CM | POA: Diagnosis not present

## 2017-04-10 DIAGNOSIS — M6281 Muscle weakness (generalized): Secondary | ICD-10-CM | POA: Diagnosis not present

## 2017-04-10 DIAGNOSIS — R748 Abnormal levels of other serum enzymes: Secondary | ICD-10-CM | POA: Diagnosis not present

## 2017-06-12 NOTE — Progress Notes (Signed)
Garrett Hogan - Initial Visit   Date: 06/13/17  Garrett Hogan MRN: [id number] DOB: May 12, 1946   Dear Leafy Kindle, PA:  Thank you for your kind referral of Garrett Hogan for consultation of bilateral leg weakness. Although his history is well known to you, please allow [place] to reiterate it for the purpose of our medical record. The patient was accompanied to the clinic by self.    History of Present Illness: Garrett Hogan is a 72 y.o. right-handed African American male with hypertension, hyperlipidemia, CAD, and GERD presenting for evaluation of bilateral leg weakness.    Patient underwent left total knee replacement in February 2018. Following his surgery he was doing well, however 2 months after rehabilitation.  In May, he started having difficulty with standing from kneeling at church and would rely on his arms to pull him up.  He denies any leg pain and mostly have weakness in the proximal legs, especially when climbing stairs.  He was given prednisone and had mild and transient improvement.  There is no numbness or tingling. He has occasional low back pain.  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 He was evaluated by rheumatology whose serologic evaluation showed mild elevation in CK. Since onset, leg weakness has remained constant and unchanged.  He was recommended to stop atorvastatin and has very mild improvement in weakness and gait, but overall feels no marked change, although clinic notes show improved motor strength.  He denies any weakness in his arms/hands, dysphagia, dysarthria, leg cramps, dark-colored urine, or myalgias.  He was previously active at the gym and tried exercising, but did not appreciate any improvement.  The only improvement that he felt was 1 month following prednisone course.    Out-side paper records, electronic medical record, and images  have been reviewed where available and summarized as:  Labs 01/2017: ESR 14, CRP 6.6, RF < 14 Labs 03/2017: CK 423, aldolase 8.2, TSH 2.27, c/p-ANCA negative, myositis panel positive for RNP*; CRP 2.6, ESR 10  Past Medical History:  Diagnosis Date  . Arthritis   . BPH (benign prostatic hyperplasia)   . CAD (coronary artery disease)   . CKD (chronic kidney disease), stage III ([place])   . Coronary artery stenosis   . GERD (gastroesophageal reflux disease)   . Headache   . Hepatitis    hx of Hep C - 1970s   . Hypercholesteremia   . Hypertension   . LVH (left ventricular hypertrophy)   . Murmur, cardiac     Past Surgical History:  Procedure Laterality Date  . CARDIOVASCULAR STRESS TEST  2014  . PARTIAL KNEE ARTHROPLASTY Right 10/10/2014   Procedure: RIGHT UNI-KNEE ARTHROPLASTY MEDIALLY;  Surgeon: Paralee Cancel, MD;  Location: [facility];  Service: Orthopedics;  Laterality: Right;  . PARTIAL KNEE ARTHROPLASTY Left 07/23/2016   Procedure: LEFT MEDIALLY UNICOMPARTMENTAL KNEE;  Surgeon: Paralee Cancel, MD;  Location: [facility];  Service: Orthopedics;  Laterality: Left;  . TONSILLECTOMY    . VARICOSE VEIN SURGERY     left leg      Medications:  Outpatient Encounter Medications as of 06/13/2017  Medication Sig  . atorvastatin (LIPITOR) 10 MG tablet Take 10 mg by mouth at bedtime.  . cloNIDine (CATAPRES) 0.2 MG tablet Take 0.2 mg by mouth 2 (two) times daily.  . Coenzyme Q10 (COQ10 PO) Take 1 capsule by mouth at bedtime.  . cyclobenzaprine (FLEXERIL) 10  MG tablet Take 1 tablet (10 mg total) by mouth 3 (three) times daily as needed for muscle spasms. (Patient taking differently: Take 10 mg by mouth 2 (two) times daily. )  . diltiazem (TIAZAC) 360 MG 24 hr capsule Take 360 mg by mouth at bedtime.  . hydrochlorothiazide (HYDRODIURIL) 25 MG tablet Take 25 mg by mouth at bedtime.  . naproxen sodium (ALEVE) 220 MG tablet Take 220 mg by mouth.  . [DISCONTINUED] docusate sodium (COLACE) 100 MG capsule Take 1  capsule (100 mg total) by mouth 2 (two) times daily.  . [DISCONTINUED] ferrous sulfate (FERROUSUL) 325 (65 FE) MG tablet Take 1 tablet (325 mg total) by mouth 3 (three) times daily with meals.  . [DISCONTINUED] polyethylene glycol (MIRALAX / GLYCOLAX) packet Take 17 g by mouth 2 (two) times daily.  . [DISCONTINUED] sodium chloride (OCEAN) 0.65 % SOLN nasal spray Place 1 spray into both nostrils 2 (two) times daily as needed for congestion.   No facility-administered encounter medications on file as of 06/13/2017.      Allergies: No Known Allergies  Family History: Family History  Problem Relation Age of Onset  . Heart disease Mother        Died at 60  . Hypertension Mother   . Heart attack Father        Died in 12s  . Cancer Father   . Lung cancer Sister   . Healthy Brother     Social History: Social History   Tobacco Use  . Smoking status: Former Smoker    Packs/day: 0.50    Years: 30.00    Pack years: 15.00    Types: Cigarettes    Last attempt to quit: 1988    Years since quitting: 31.0  . Smokeless tobacco: Never Used  Substance Use Topics  . Alcohol use: Yes    Comment: drinks wine once per month   . Drug use: No   Social History   Social History Narrative   Lives with wife in a 2 story home.  Has 2 children.     Works Education administrator buildings - small business     Education: 2 years of college.     Review of Systems:  CONSTITUTIONAL: No fevers, chills, night sweats, or weight loss.   EYES: No visual changes or eye pain ENT: No hearing changes.  No history of nose bleeds.   RESPIRATORY: No cough, wheezing and shortness of breath.   CARDIOVASCULAR: Negative for chest pain, and palpitations.   GI: Negative for abdominal discomfort, blood in stools or black stools.  No recent change in bowel habits.   GU:  No history of incontinence.   MUSCLOSKELETAL: No history of joint pain or swelling.  No myalgias.   SKIN: Negative for lesions, rash, and itching.     HEMATOLOGY/ONCOLOGY: Negative for prolonged bleeding, bruising easily, and swollen nodes.  No history of cancer.   ENDOCRINE: Negative for cold or heat intolerance, polydipsia or goiter.   PSYCH:  No depression or anxiety symptoms.   NEURO: As Above.   Vital Signs:  BP 124/84   Pulse 82   Ht _0  (1.905 m)   Wt 235 lb 8 oz (106.8 kg)   SpO2 98%   BMI 29.44 kg/m    General Medical Exam:   General:  Well appearing, comfortable.   Eyes/ENT: see cranial nerve examination.   Neck: No masses appreciated.  Full range of motion without tenderness.  No carotid bruits. Respiratory:  Clear to auscultation,  good air entry bilaterally.   Cardiac:  Regular rate and rhythm, no murmur.   Extremities:  No deformities, edema, or skin discoloration.  Skin:  No rashes or lesions.  Neurological Exam: MENTAL STATUS including orientation to time, place, person, recent and remote memory, attention span and concentration, language, and fund of knowledge is normal.  Speech is not dysarthric.  CRANIAL NERVES: II:  No visual field defects.  Unremarkable fundi.   III-IV-VI: Pupils equal round and reactive to light.  Normal conjugate, extra-ocular eye movements in all directions of gaze.  No nystagmus.  No ptosis.   V:  Normal facial sensation.   VII:  Normal facial symmetry and movements.  VIII:  Normal hearing and vestibular function.   IX-X:  Normal palatal movement.   XI:  Normal shoulder shrug and head rotation.   XII:  Normal tongue strength and range of motion, no deviation or fasciculation.  MOTOR:  No atrophy, fasciculations or abnormal movements.  No pronator drift.  Tone is normal.   Neck flexion and extension is 5/5  Right Upper Extremity:    Left Upper Extremity:    Deltoid  5/5   Deltoid  5/5   Biceps  5/5   Biceps  5/5   Triceps  5/5   Triceps  5/5   Wrist extensors  5/5   Wrist extensors  5/5   Wrist flexors  5/5   Wrist flexors  5/5   Finger extensors  5/5   Finger extensors  5/5    Finger flexors  5/5   Finger flexors  5/5   Dorsal interossei  5/5   Dorsal interossei  5/5   Abductor pollicis  5/5   Abductor pollicis  5/5   Tone (Ashworth scale)  0  Tone (Ashworth scale)  0   Right Lower Extremity:    Left Lower Extremity:    Hip flexors  4+/5   Hip flexors  4+/5   Hip extensors  5/5   Hip extensors  5/5   Abductor 5/5  Abductor 5/5  Adductor 4+/5  Adductor 4+/5  Knee flexors  5/5   Knee flexors  5/5   Knee extensors  5/5   Knee extensors  5/5   Dorsiflexors  5/5   Dorsiflexors  5/5   Plantarflexors  5/5   Plantarflexors  5/5   Toe extensors  5/5   Toe extensors  5/5   Toe flexors  5/5   Toe flexors  5/5   Tone (Ashworth scale)  0  Tone (Ashworth scale)  0   MSRs:  Right                                                                 Left brachioradialis 2+  brachioradialis 2+  biceps 2+  biceps 2+  triceps 2+  triceps 2+  patellar 2+  patellar 2+  ankle jerk 1+  ankle jerk 1+  Hoffman no  Hoffman no  plantar response down  plantar response down   SENSORY:  Normal and symmetric perception of light touch, pinprick, vibration, and proprioception.  Romberg's sign absent.   COORDINATION/GAIT: Normal finger-to- nose-finger and heel-to-shin.  Intact rapid alternating movements bilaterally.  He is able to rise from a chair without using arms with effort.  Gait appears stable, unassisted.  Tandem and stressed gait intact.    IMPRESSION: Mr. Crevier is a delightful 72 year-old man referred for evaluation of proximal leg weakness in the setting of hyperCKemia.  His exam shows weakness of quadriceps and adductor muscle groups, with preserved strength of the lower legs and upper extremity.  Reflexes are relatively intact making L2-4 radiculopathy less likely.  I will set him up with NCS/EMG of the legs to evaluate for myopathic process, and if present, would guide potential muscle biopsy.  Initially, there was concern of statin-induced myopathy, but he has been off  this for several month without any marked change.  Recommend staying off statin therapy for now.  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  I will recheck CK and aldolase to assess the trend and check for infiltrative diseases (ACE, SPEP with IFE).  He does not have finger flexion weakness or dysphagia, making inclusion body myositis less likely.  Thank you for allowing me to participate in patient's care.  If I can answer any additional questions, I would be pleased to do so.    Sincerely,    Hebe Merriwether K. Posey Pronto, DO

## 2017-06-13 ENCOUNTER — Encounter: Payer: Self-pay | Admitting: Neurology

## 2017-06-13 ENCOUNTER — Other Ambulatory Visit (INDEPENDENT_AMBULATORY_CARE_PROVIDER_SITE_OTHER): Payer: Medicare HMO

## 2017-06-13 ENCOUNTER — Ambulatory Visit: Payer: Medicare HMO | Admitting: Neurology

## 2017-06-13 VITALS — BP 124/84 | HR 82 | Ht 75.0 in | Wt 235.5 lb

## 2017-06-13 DIAGNOSIS — R29898 Other symptoms and signs involving the musculoskeletal system: Secondary | ICD-10-CM

## 2017-06-13 DIAGNOSIS — R748 Abnormal levels of other serum enzymes: Secondary | ICD-10-CM | POA: Insufficient documentation

## 2017-06-13 LAB — CK: CK TOTAL: 324 U/L — AB (ref 7–232)

## 2017-06-13 NOTE — Patient Instructions (Signed)
1.  Check labs 2.  Nerve testing of both legs.  Please do no apply lotion on your legs.

## 2017-06-18 LAB — PROTEIN ELECTROPHORESIS, SERUM
ALPHA 2: 0.8 g/dL (ref 0.5–0.9)
Albumin ELP: 4.5 g/dL (ref 3.8–4.8)
Alpha 1: 0.3 g/dL (ref 0.2–0.3)
Beta 2: 0.4 g/dL (ref 0.2–0.5)
Beta Globulin: 0.4 g/dL (ref 0.4–0.6)
Gamma Globulin: 1.2 g/dL (ref 0.8–1.7)
TOTAL PROTEIN: 7.5 g/dL (ref 6.1–8.1)

## 2017-06-18 LAB — IMMUNOFIXATION ELECTROPHORESIS
IGM, SERUM: 99 mg/dL (ref 48–271)
IMMUNOFIX ELECTR INT: NOT DETECTED
IgG (Immunoglobin G), Serum: 1408 mg/dL (ref 694–1618)
Immunoglobulin A: 195 mg/dL (ref 81–463)

## 2017-06-18 LAB — ANGIOTENSIN CONVERTING ENZYME: ANGIOTENSIN-CONVERTING ENZYME: 67 U/L (ref 9–67)

## 2017-06-18 LAB — ALDOLASE

## 2017-06-19 ENCOUNTER — Ambulatory Visit (INDEPENDENT_AMBULATORY_CARE_PROVIDER_SITE_OTHER): Payer: Medicare HMO | Admitting: Neurology

## 2017-06-19 DIAGNOSIS — M5417 Radiculopathy, lumbosacral region: Secondary | ICD-10-CM

## 2017-06-19 DIAGNOSIS — R29898 Other symptoms and signs involving the musculoskeletal system: Secondary | ICD-10-CM | POA: Diagnosis not present

## 2017-06-19 NOTE — Progress Notes (Signed)
    Follow-up Visit   Date: 06/19/17    Garrett Hogan MRN: [id number] DOB: 1945/10/11   Interim History: Garrett Hogan is a 72 y.o. right-handed African American male with hypertension, hyperlipidemia, CAD, and GERD returning to the clinic for electrodiagnostic testing of the legs and discuss lab results.  He continues to have bilateral leg weakness, most pronounced when he is standing to raise from a low chair or climbing stairs. Overall there has been no change since he was last here. His CK and most recent lab remains mildly elevated at 324. There is no evidence of a paraproteinemia or sarcoidosis.  DATA: Labs 06/13/2017: CK 324*, SPEP with IFE no M protein, ACE 67 NCS/EMG of the legs 06/19/2017: 1. 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 2. 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 3. There is no evidence of a diffuse myopathy or sensorimotor polyneuropathy.  Electrodiagnostic testing shows right L5 radiculopathy and left S1 radiculopathy, without evidence of diffuse myopathy. At this juncture, I would like to proceed with MRI of the lumbar spine to evaluate for a lumbar foraminal or canal stenosis, which could potentially explain his proximal leg weakness. Patient is requesting open MRI due to claustrophobia. If this is normal, the next step would be muscle biopsy.  All questions were answered.  Greater than 80% of this 15 minute visit was spent in counseling, explanation of diagnosis, planning of further management, and coordination of care, excluding procedure time.  Burgandy Hackworth K. Allena KatzPatel, DO

## 2017-06-19 NOTE — Procedures (Signed)
[facility]  [address], [address]  [place], [place] [address] Tel: [phone] Fax:  [phone] Test Date:  06/19/2017  Patient: Garrett Hogan DOB: [birth year] Physician: Nita Sickle, DO  Sex: Male Height: 6\' 4"  Ref Phys: Nita Sickle, DO  ID#: [id number] Temp: 35.1C Technician:    Patient Complaints: This is 72 year old gentleman referred for evaluation of bilateral leg weakness and hyperCKemia.  NCV & EMG Findings: Extensive electrodiagnostic testing of the right lower extremity and additional studies of the left shows:  1. Bilateral sural and superficial peroneal sensory responses are within normal limits. 2. Bilateral peroneal motor responses are within normal limits. Bilateral tibial motor responses show reduced amplitude and there is borderline conduction velocity slowing on the left. 3. Right tibial H reflex study is within normal limits. 4. Chronic motor axon loss changes are seen affecting the right L5 myotomes and left S1 myotomes, without accompanied active denervation.  Impression: 1. 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 2. 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 3. There is no evidence of a diffuse myopathy or sensorimotor polyneuropathy.   ___________________________ Nita Sickle, DO    Nerve Conduction Studies Anti Sensory Summary Table   Site NR Peak (ms) Norm Peak (ms) P-T Amp (V) Norm P-T Amp  Left Sup Peroneal Anti Sensory (Ant Lat Mall)  35.1C  12 cm    4.1 <4.6 4.6 >3  Right Sup Peroneal Anti Sensory (Ant Lat Mall)  35.1C  12 cm    2.7 <4.6 4.7 >3  Left Sural Anti Sensory (Lat Mall)  35.1C  Calf    4.3 <4.6 6.3 >3  Right Sural Anti Sensory (Lat Mall)  35.1C  Calf    2.9 <4.6 6.9 >3   Motor Summary Table   Site NR Onset (ms) Norm Onset (ms) O-P Amp (mV) Norm O-P Amp Site1 Site2 Delta-0 (ms) Dist (cm) Vel (m/s) Norm Vel  (m/s)  Left Peroneal Motor (Ext Dig Brev)  35.1C  Ankle    3.8 <6.0 6.8 >2.5 B Fib Ankle 10.0 42.0 42 >40  B Fib    13.8  5.4  Poplt B Fib 0.9 10.0 111 >40  Poplt    14.7  5.3         Right Peroneal Motor (Ext Dig Brev)  35.1C  Ankle    3.9 <6.0 6.4 >2.5 B Fib Ankle 8.8 42.0 48 >40  B Fib    12.7  5.6  Poplt B Fib 1.6 10.0 62 >40  Poplt    14.3  5.5         Left Peroneal TA Motor (Tib Ant)  35.1C  Fib Head    2.7 <4.5 5.1 >3 Poplit Fib Head 1.3 8.0 62 >40  Poplit    4.0  5.0         Right Peroneal TA Motor (Tib Ant)  35.1C  Fib Head    2.4 <4.5 5.0 >3 Poplit Fib Head 1.4 8.0 57 >40  Poplit    3.8  4.9         Left Tibial Motor (Abd Hall Brev)  35.1C  Ankle    5.1 <6.0 1.5 >4 Knee Ankle 11.5 45.0 39 >40  Knee    16.6  0.9         Right Tibial Motor (Abd Hall Brev)  35.1C  Ankle    4.5 <6.0 2.1 >4 Knee Ankle 10.6 44.0 42 >40  Knee    15.1  1.0          H Reflex Studies   NR H-Lat (ms) Lat Norm (ms) L-R H-Lat (ms)  Right Tibial (Gastroc)  35.1C     32.24 <35    EMG   Side Muscle Ins Act Fibs Psw Fasc Number Recrt Dur Dur. Amp Amp. Poly Poly. Comment  Right AntTibialis Nml Nml Nml Nml 1- Rapid Some 1+ Some 1+ Nml Nml N/A  Right Gastroc Nml Nml Nml Nml Nml Nml Nml Nml Nml Nml Nml Nml N/A  Right Flex Dig Long Nml Nml Nml Nml 2- Rapid Some 1+ Some 1+ Nml Nml N/A  Right RectFemoris Nml Nml Nml Nml Nml Nml Nml Nml Nml Nml Nml Nml N/A  Right Iliopsoas Nml Nml Nml Nml Nml Nml Nml Nml Nml Nml Nml Nml N/A  Right GluteusMed Nml Nml Nml Nml 1- Rapid Some 1+ Some 1+ Nml Nml N/A  Right GluteusMax Nml Nml Nml Nml Nml Nml Nml Nml Nml Nml Nml Nml N/A  Right Lumbo Parasp Low Nml Nml Nml Nml Nml Nml Nml Nml Nml Nml Nml Nml N/A  Left AntTibialis Nml Nml Nml Nml Nml Nml Nml Nml Nml Nml Nml Nml N/A  Left Gastroc Nml Nml Nml Nml 1- Rapid Some 1+ Some 1+ Nml Nml N/A  Left Flex Dig Long Nml Nml Nml Nml Nml Nml Nml Nml Nml Nml Nml Nml N/A  Left RectFemoris Nml Nml Nml Nml Nml Nml Nml Nml Nml Nml  Nml Nml N/A  Left GluteusMed Nml Nml Nml Nml Nml Nml Nml Nml Nml Nml Nml Nml N/A  Left Iliopsoas Nml Nml Nml Nml Nml Nml Nml Nml Nml Nml Nml Nml N/A  Left BicepsFemS Nml Nml Nml Nml 1- Rapid Some 1+ Some 1+ Nml Nml N/A      Waveforms:

## 2017-06-20 ENCOUNTER — Other Ambulatory Visit: Payer: Self-pay | Admitting: *Deleted

## 2017-06-20 DIAGNOSIS — R29898 Other symptoms and signs involving the musculoskeletal system: Secondary | ICD-10-CM

## 2017-07-02 DIAGNOSIS — Z23 Encounter for immunization: Secondary | ICD-10-CM | POA: Diagnosis not present

## 2017-07-02 DIAGNOSIS — M545 Low back pain: Secondary | ICD-10-CM | POA: Diagnosis not present

## 2017-07-03 ENCOUNTER — Encounter: Payer: Medicare HMO | Admitting: Neurology

## 2017-07-14 DIAGNOSIS — E669 Obesity, unspecified: Secondary | ICD-10-CM | POA: Diagnosis not present

## 2017-07-14 DIAGNOSIS — E7800 Pure hypercholesterolemia, unspecified: Secondary | ICD-10-CM | POA: Diagnosis not present

## 2017-07-14 DIAGNOSIS — R748 Abnormal levels of other serum enzymes: Secondary | ICD-10-CM | POA: Diagnosis not present

## 2017-07-14 DIAGNOSIS — M6281 Muscle weakness (generalized): Secondary | ICD-10-CM | POA: Diagnosis not present

## 2017-07-14 DIAGNOSIS — R269 Unspecified abnormalities of gait and mobility: Secondary | ICD-10-CM | POA: Diagnosis not present

## 2017-07-14 DIAGNOSIS — Z6830 Body mass index (BMI) 30.0-30.9, adult: Secondary | ICD-10-CM | POA: Diagnosis not present

## 2017-07-23 DIAGNOSIS — M9903 Segmental and somatic dysfunction of lumbar region: Secondary | ICD-10-CM | POA: Diagnosis not present

## 2017-07-23 DIAGNOSIS — M47816 Spondylosis without myelopathy or radiculopathy, lumbar region: Secondary | ICD-10-CM | POA: Diagnosis not present

## 2017-07-30 DIAGNOSIS — Z131 Encounter for screening for diabetes mellitus: Secondary | ICD-10-CM | POA: Diagnosis not present

## 2017-07-30 DIAGNOSIS — E1165 Type 2 diabetes mellitus with hyperglycemia: Secondary | ICD-10-CM | POA: Diagnosis not present

## 2017-07-30 DIAGNOSIS — I10 Essential (primary) hypertension: Secondary | ICD-10-CM | POA: Diagnosis not present

## 2017-07-30 DIAGNOSIS — E7800 Pure hypercholesterolemia, unspecified: Secondary | ICD-10-CM | POA: Diagnosis not present

## 2017-07-30 DIAGNOSIS — M47812 Spondylosis without myelopathy or radiculopathy, cervical region: Secondary | ICD-10-CM | POA: Diagnosis not present

## 2017-07-30 DIAGNOSIS — Z125 Encounter for screening for malignant neoplasm of prostate: Secondary | ICD-10-CM | POA: Diagnosis not present

## 2017-07-30 DIAGNOSIS — N183 Chronic kidney disease, stage 3 (moderate): Secondary | ICD-10-CM | POA: Diagnosis not present

## 2017-07-30 DIAGNOSIS — Z0000 Encounter for general adult medical examination without abnormal findings: Secondary | ICD-10-CM | POA: Diagnosis not present

## 2017-09-16 DIAGNOSIS — I340 Nonrheumatic mitral (valve) insufficiency: Secondary | ICD-10-CM | POA: Diagnosis not present

## 2017-09-16 DIAGNOSIS — I517 Cardiomegaly: Secondary | ICD-10-CM | POA: Diagnosis not present

## 2017-09-16 DIAGNOSIS — I739 Peripheral vascular disease, unspecified: Secondary | ICD-10-CM | POA: Diagnosis not present

## 2017-09-16 DIAGNOSIS — I350 Nonrheumatic aortic (valve) stenosis: Secondary | ICD-10-CM | POA: Diagnosis not present

## 2017-09-18 DIAGNOSIS — M47816 Spondylosis without myelopathy or radiculopathy, lumbar region: Secondary | ICD-10-CM | POA: Diagnosis not present

## 2017-09-18 DIAGNOSIS — M9903 Segmental and somatic dysfunction of lumbar region: Secondary | ICD-10-CM | POA: Diagnosis not present

## 2017-09-22 DIAGNOSIS — K5792 Diverticulitis of intestine, part unspecified, without perforation or abscess without bleeding: Secondary | ICD-10-CM | POA: Diagnosis not present

## 2017-09-22 DIAGNOSIS — K5900 Constipation, unspecified: Secondary | ICD-10-CM | POA: Diagnosis not present

## 2017-09-22 DIAGNOSIS — I10 Essential (primary) hypertension: Secondary | ICD-10-CM | POA: Diagnosis not present

## 2017-09-22 DIAGNOSIS — R109 Unspecified abdominal pain: Secondary | ICD-10-CM | POA: Diagnosis not present

## 2017-09-24 DIAGNOSIS — M79605 Pain in left leg: Secondary | ICD-10-CM | POA: Diagnosis not present

## 2017-10-09 ENCOUNTER — Telehealth: Payer: Self-pay | Admitting: Neurology

## 2017-10-09 NOTE — Telephone Encounter (Signed)
Patient is having leg pain no swelling or warm to the touch. He wants to be seen sooner than her first appt in oct he states that pain is in the left leg and moves up and down the leg.

## 2017-10-09 NOTE — Telephone Encounter (Signed)
I spoke with patient to see if he could come in today but he has had a death in the family.  Requested for him to have his MRI L-spine done.  He said that no one ever called him and the order was placed in January.  I sent in another order and have put him on the waiting list.

## 2017-10-10 ENCOUNTER — Other Ambulatory Visit: Payer: Self-pay | Admitting: *Deleted

## 2017-10-10 DIAGNOSIS — R29898 Other symptoms and signs involving the musculoskeletal system: Secondary | ICD-10-CM

## 2017-10-10 DIAGNOSIS — M5417 Radiculopathy, lumbosacral region: Secondary | ICD-10-CM

## 2017-10-20 DIAGNOSIS — M545 Low back pain: Secondary | ICD-10-CM | POA: Diagnosis not present

## 2017-10-20 DIAGNOSIS — M79605 Pain in left leg: Secondary | ICD-10-CM | POA: Diagnosis not present

## 2017-10-21 ENCOUNTER — Telehealth: Payer: Self-pay | Admitting: *Deleted

## 2017-10-21 NOTE — Telephone Encounter (Signed)
Called patient and left message that we will need to see him again before we can schedule an MRI since insurance requires for him to be seen within 60 days.  Mailed appointment card also.  May 30 at 3:30.

## 2017-10-28 ENCOUNTER — Telehealth: Payer: Self-pay | Admitting: Neurology

## 2017-10-28 NOTE — Telephone Encounter (Signed)
Patient called to cancel his follow up scheduled for 10/30/17. He went to see another Doctor regarding his back and they have an MRI scheduled for this Thursday 5/30. So he cancelled his appointment. Thanks

## 2017-10-28 NOTE — Telephone Encounter (Signed)
FYI

## 2017-10-29 DIAGNOSIS — M545 Low back pain: Secondary | ICD-10-CM | POA: Diagnosis not present

## 2017-10-30 ENCOUNTER — Ambulatory Visit: Payer: Medicare HMO | Admitting: Neurology

## 2017-10-31 DIAGNOSIS — M545 Low back pain: Secondary | ICD-10-CM | POA: Diagnosis not present

## 2017-11-10 DIAGNOSIS — M545 Low back pain: Secondary | ICD-10-CM | POA: Diagnosis not present

## 2017-11-11 DIAGNOSIS — E663 Overweight: Secondary | ICD-10-CM | POA: Diagnosis not present

## 2017-11-11 DIAGNOSIS — Z6829 Body mass index (BMI) 29.0-29.9, adult: Secondary | ICD-10-CM | POA: Diagnosis not present

## 2017-11-11 DIAGNOSIS — E7800 Pure hypercholesterolemia, unspecified: Secondary | ICD-10-CM | POA: Diagnosis not present

## 2017-11-11 DIAGNOSIS — R269 Unspecified abnormalities of gait and mobility: Secondary | ICD-10-CM | POA: Diagnosis not present

## 2017-11-11 DIAGNOSIS — M6281 Muscle weakness (generalized): Secondary | ICD-10-CM | POA: Diagnosis not present

## 2017-11-11 DIAGNOSIS — R748 Abnormal levels of other serum enzymes: Secondary | ICD-10-CM | POA: Diagnosis not present

## 2017-11-14 DIAGNOSIS — M545 Low back pain: Secondary | ICD-10-CM | POA: Diagnosis not present

## 2017-11-17 DIAGNOSIS — M545 Low back pain: Secondary | ICD-10-CM | POA: Diagnosis not present

## 2017-11-21 DIAGNOSIS — M545 Low back pain: Secondary | ICD-10-CM | POA: Diagnosis not present

## 2017-11-24 DIAGNOSIS — M545 Low back pain: Secondary | ICD-10-CM | POA: Diagnosis not present

## 2017-11-28 DIAGNOSIS — M545 Low back pain: Secondary | ICD-10-CM | POA: Diagnosis not present

## 2017-12-05 DIAGNOSIS — M545 Low back pain: Secondary | ICD-10-CM | POA: Diagnosis not present

## 2017-12-08 DIAGNOSIS — M545 Low back pain: Secondary | ICD-10-CM | POA: Diagnosis not present

## 2017-12-12 DIAGNOSIS — M545 Low back pain: Secondary | ICD-10-CM | POA: Diagnosis not present

## 2017-12-16 DIAGNOSIS — M545 Low back pain: Secondary | ICD-10-CM | POA: Diagnosis not present

## 2017-12-19 DIAGNOSIS — M545 Low back pain: Secondary | ICD-10-CM | POA: Diagnosis not present

## 2017-12-23 DIAGNOSIS — M545 Low back pain: Secondary | ICD-10-CM | POA: Diagnosis not present

## 2017-12-26 DIAGNOSIS — M545 Low back pain: Secondary | ICD-10-CM | POA: Diagnosis not present

## 2018-01-28 DIAGNOSIS — I10 Essential (primary) hypertension: Secondary | ICD-10-CM | POA: Diagnosis not present

## 2018-01-28 DIAGNOSIS — E7800 Pure hypercholesterolemia, unspecified: Secondary | ICD-10-CM | POA: Diagnosis not present

## 2018-01-28 DIAGNOSIS — N183 Chronic kidney disease, stage 3 (moderate): Secondary | ICD-10-CM | POA: Diagnosis not present

## 2018-02-20 DIAGNOSIS — M545 Low back pain: Secondary | ICD-10-CM | POA: Diagnosis not present

## 2018-02-23 DIAGNOSIS — M545 Low back pain: Secondary | ICD-10-CM | POA: Diagnosis not present

## 2018-02-26 ENCOUNTER — Ambulatory Visit (INDEPENDENT_AMBULATORY_CARE_PROVIDER_SITE_OTHER): Payer: Medicare HMO | Admitting: Neurology

## 2018-02-26 ENCOUNTER — Encounter: Payer: Self-pay | Admitting: Neurology

## 2018-02-26 VITALS — BP 120/70 | HR 80 | Ht 74.0 in | Wt 235.4 lb

## 2018-02-26 DIAGNOSIS — R29898 Other symptoms and signs involving the musculoskeletal system: Secondary | ICD-10-CM

## 2018-02-26 DIAGNOSIS — M2142 Flat foot [pes planus] (acquired), left foot: Secondary | ICD-10-CM

## 2018-02-26 DIAGNOSIS — M5417 Radiculopathy, lumbosacral region: Secondary | ICD-10-CM | POA: Diagnosis not present

## 2018-02-26 MED ORDER — GABAPENTIN 300 MG PO CAPS: 300.0000 mg | ORAL_CAPSULE | Freq: Every day | ORAL | 5 refills | Status: DC

## 2018-02-26 NOTE — Progress Notes (Signed)
Follow-up Visit   Date: 02/26/18    Garrett Hogan MRN: [id number] DOB: 1945-06-17   Interim History: Garrett Hogan is a 72 y.o. right-handedAfrican Americanmalewith hypertension,hyperlipidemia, CAD, and GERDreturning to clinic for follow-up of lumbosacral radiculopathy.  The patient was accompanied to the clinic by self.  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 Following his surgery he was doing well, however 2 months after rehabilitation.  In May, he started having difficulty with standing from kneeling at church and would rely on his arms to pull him up.  He denies any leg pain and mostly have weakness in the proximal legs, especially when climbing stairs.  He was given prednisone and had mild and transient improvement.  There is no numbness or tingling. He has occasional low back pain.  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 He was evaluated by rheumatology whose serologic evaluation showed mild elevation in CK. Since onset, leg weakness has remained constant and unchanged.  He was recommended to stop atorvastatin and has very mild improvement in weakness and gait, but overall feels no marked change, although clinic notes show improved motor strength.  He denies any weakness in his arms/hands, dysphagia, dysarthria, leg cramps, dark-colored urine, or myalgias.  He was previously active at the gym and tried exercising, but did not appreciate any improvement.  The only improvement that he felt was 1 month following prednisone course.    UPDATE 02/26/2018: He was last seen in the clinic in January for EDX which showed right L5 radiculopathy and left S1 radiculopathy, without evidence of diffuse myopathy.   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  He started physical therapy which  helped his proximal leg strength, and stopped it in July.  Around early September, he started having increased low back pain and tingling in the legs.     Medications:  Current Outpatient Medications on File Prior to Visit  Medication Sig Dispense Refill  . cloNIDine (CATAPRES) 0.2 MG tablet Take 0.2 mg by mouth 2 (two) times daily.    Marland Kitchen diltiazem (TIAZAC) 360 MG 24 hr capsule Take 360 mg by mouth at bedtime.  5  . naproxen sodium (ALEVE) 220 MG tablet Take 220 mg by mouth.     No current facility-administered medications on file prior to visit.     Allergies: No Known Allergies  Review of Systems:  CONSTITUTIONAL: No fevers, chills, night sweats, or weight loss.  EYES: No visual changes or eye pain ENT: No hearing changes.  No history of nose bleeds.   RESPIRATORY: No cough, wheezing and shortness of breath.   CARDIOVASCULAR: Negative for chest pain, and palpitations.   GI: Negative for abdominal discomfort, blood in stools or black stools.  No recent change in bowel habits.   GU:  No history of incontinence.   MUSCLOSKELETAL: No history of joint pain or swelling.  No myalgias.   SKIN: Negative for lesions, rash, and itching.   ENDOCRINE: Negative for cold or heat intolerance, polydipsia or goiter.   PSYCH:  No depression or anxiety symptoms.   NEURO: As Above.   Vital Signs:  BP 120/70   Pulse 80   Ht 6\' 2"  (1.88 m)   Wt 235 lb 6 oz (106.8 kg)   SpO2 98%   BMI 30.22 kg/m   General  Medical Exam:   General:  Well appearing, comfortable  Eyes/ENT: see cranial nerve examination.   Neck: No masses appreciated.  Full range of motion without tenderness.  No carotid bruits. Respiratory:  Clear to auscultation, good air entry bilaterally.   Cardiac:  Regular rate and rhythm, no murmur.   Ext:  No edema  Neurological Exam: MENTAL STATUS including orientation to time, place, person, recent and remote memory, attention span and concentration, language, and fund of knowledge is  normal.  Speech is not dysarthric.  CRANIAL NERVES:  Face is symmetric.   MOTOR:  Motor strength is 5/5 in all extremities, except left foot dorsiflexion, eversion, and inversion is 4+/5.  Hip flexors and adductors are 5/5 (improved).   No atrophy, fasciculations or abnormal movements.  No pronator drift.  Tone is normal.    MSRs:  Reflexes are 2+/4 throughout, except 1+ bilateral Achilles.  SENSORY:  Vibration is reduced at the great toe bilaterally. Temperature is intact.Marland Kitchen  COORDINATION/GAIT:  Gait is narrow based, stable and unassisted.  He can toe walk.  Heel walking is impaired on the left, intact on the right.  Data: Labs 06/13/2017: CK 324*, SPEP with IFE no M protein, ACE 67 NCS/EMG of the legs 06/19/2017: 1. 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 2. 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 3. There is no evidence of a diffuse myopathy or sensorimotor polyneuropathy.   MRI lumbar spine 10/29/2017: 1.  Moderate left and mild-moderate foraminal stenosis at L5-S1, with severe facet arthropathy and moderate facet effusions 2.  Mild-moderate right foraminal stenosis at L4-5, with moderate facet arthropathy and moderate left facet effusion  IMPRESSION/PLAN: Lumbar spondylosis with radiculopathy affecting bilateral L5-S1 and right L4-5 nerve segments.  With his new left foot weakness, it is likely that he is having increased impingement on the left L5 nerve.  I do not have images to view personally as patient only came with MRI report, which was performed at [facility]. Images will be requested. In the meantime, he was encouraged to continue physical therapy.  For paresthesias, start gabapentin 300mg  at bedtime.  He was instructed to call my office for new weakness or worsening pain.  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  No evidence of myopathy on EMG.  Return to clinic in 4 months, or sooner  as needed.     Thank you for allowing me to participate in patient's care.  If I can answer any additional questions, I would be pleased to do so.    Sincerely,    Eltha Tingley K. Allena Katz, DO

## 2018-02-26 NOTE — Patient Instructions (Signed)
Start gabapentin 300mg  at bedtime   Continue physical therapy exercises  Return to clinic in 4 months

## 2018-02-27 DIAGNOSIS — M545 Low back pain: Secondary | ICD-10-CM | POA: Diagnosis not present

## 2018-03-02 DIAGNOSIS — M545 Low back pain: Secondary | ICD-10-CM | POA: Diagnosis not present

## 2018-03-04 DIAGNOSIS — M47816 Spondylosis without myelopathy or radiculopathy, lumbar region: Secondary | ICD-10-CM | POA: Diagnosis not present

## 2018-03-04 DIAGNOSIS — M9903 Segmental and somatic dysfunction of lumbar region: Secondary | ICD-10-CM | POA: Diagnosis not present

## 2018-03-06 ENCOUNTER — Ambulatory Visit: Payer: Medicare HMO | Admitting: Neurology

## 2018-03-06 ENCOUNTER — Encounter

## 2018-03-09 DIAGNOSIS — M545 Low back pain: Secondary | ICD-10-CM | POA: Diagnosis not present

## 2018-03-10 DIAGNOSIS — M545 Low back pain: Secondary | ICD-10-CM | POA: Diagnosis not present

## 2018-03-11 DIAGNOSIS — M9903 Segmental and somatic dysfunction of lumbar region: Secondary | ICD-10-CM | POA: Diagnosis not present

## 2018-03-11 DIAGNOSIS — M47816 Spondylosis without myelopathy or radiculopathy, lumbar region: Secondary | ICD-10-CM | POA: Diagnosis not present

## 2018-03-13 DIAGNOSIS — M545 Low back pain: Secondary | ICD-10-CM | POA: Diagnosis not present

## 2018-03-18 DIAGNOSIS — J029 Acute pharyngitis, unspecified: Secondary | ICD-10-CM | POA: Diagnosis not present

## 2018-03-18 DIAGNOSIS — R05 Cough: Secondary | ICD-10-CM | POA: Diagnosis not present

## 2018-03-19 DIAGNOSIS — M545 Low back pain: Secondary | ICD-10-CM | POA: Diagnosis not present

## 2018-03-24 DIAGNOSIS — M5416 Radiculopathy, lumbar region: Secondary | ICD-10-CM | POA: Diagnosis not present

## 2018-03-24 DIAGNOSIS — M545 Low back pain: Secondary | ICD-10-CM | POA: Diagnosis not present

## 2018-03-24 DIAGNOSIS — M48061 Spinal stenosis, lumbar region without neurogenic claudication: Secondary | ICD-10-CM | POA: Diagnosis not present

## 2018-06-30 NOTE — Progress Notes (Signed)
Follow-up Visit   Date: 07/01/18    Garrett Hogan MRN: [id number] DOB: 05-25-1946   Interim History: Garrett Hogan is a 73 y.o. right-handedAfrican Americanmalewith hypertension,hyperlipidemia, CAD, and GERDreturning to clinic for follow-up of lumbosacral radiculopathy.  The patient was accompanied to the clinic by self.  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 Following his surgery he was doing well, however 2 months after rehabilitation.  In May, he started having difficulty with standing from kneeling at church and would rely on his arms to pull him up.  He denies any leg pain and mostly have weakness in the proximal legs, especially when climbing stairs.  He was given prednisone and had mild and transient improvement.  There is no numbness or tingling. He has occasional low back pain.  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 He was evaluated by rheumatology whose serologic evaluation showed mild elevation in CK. Since onset, leg weakness has remained constant and unchanged.  He was recommended to stop atorvastatin and has very mild improvement in weakness and gait, but overall feels no marked change, although clinic notes show improved motor strength.  He denies any weakness in his arms/hands, dysphagia, dysarthria, leg cramps, dark-colored urine, or myalgias.  He was previously active at the gym and tried exercising, but did not appreciate any improvement.  The only improvement that he felt was 1 month following prednisone course.    UPDATE 02/26/2018: He was last seen in the clinic in January for EDX which showed right L5 radiculopathy and left S1 radiculopathy, without evidence of diffuse myopathy.   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  He started physical therapy which  helped his proximal leg strength, and stopped it in July.  Around early September, he started having increased low back pain and tingling in the legs.    UPDATE 07/01/2018:  He is here for follow-up visit.  He is doing doing well with resolution of left foot weakness and back pain.  Since he was last here, he completed PT and saw Dr. Nelva Bush for back pain.  ESR had greatest benefit with ESI and he was able to discontinue Lyrica and gabapentin.  His last injection was two weeks ago.  No leg weakness or buckling.  He has not been back to physical therapy since last month.  He has been having GI issues and is scheduled for colonoscopy.    Medications:  Current Outpatient Medications on File Prior to Visit  Medication Sig Dispense Refill  . cloNIDine (CATAPRES) 0.2 MG tablet Take 0.2 mg by mouth 2 (two) times daily.    Marland Kitchen diltiazem (TIAZAC) 360 MG 24 hr capsule Take 360 mg by mouth at bedtime.  5  . naproxen sodium (ALEVE) 220 MG tablet Take 220 mg by mouth.     No current facility-administered medications on file prior to visit.     Allergies: No Known Allergies  Review of Systems:  CONSTITUTIONAL: No fevers, chills, night sweats, or weight loss.  EYES: No visual changes or eye pain ENT: No hearing changes.  No history of nose bleeds.   RESPIRATORY: No cough, wheezing and shortness of breath.   CARDIOVASCULAR: Negative for chest pain, and palpitations.   GI: + abdominal discomfort, blood in stools or black stools.  No recent change in bowel habits.  GU:  No history of incontinence.   MUSCLOSKELETAL: No history of joint pain or swelling.  No myalgias.   SKIN: Negative for lesions, rash, and itching.   ENDOCRINE: Negative for cold or heat intolerance, polydipsia or goiter.   PSYCH:  No depression or anxiety symptoms.   NEURO: As Above.   Vital Signs:  BP 120/78   Pulse 65   Ht 6' 2"  (1.88 m)   Wt 230 lb 4 oz (104.4 kg)   SpO2 98%   BMI 29.56 kg/m   General Medical Exam:   General:   Well appearing, comfortable  Eyes/ENT: see cranial nerve examination.   Neck:   No carotid bruits. Respiratory:  Clear to auscultation, good air entry bilaterally.   Cardiac:  Regular rate and rhythm, no murmur.   Ext: No edema  Neurological Exam: MENTAL STATUS including orientation to time, place, person, recent and remote memory, attention span and concentration, language, and fund of knowledge is normal.  Speech is not dysarthric.  CRANIAL NERVES:  Face is symmetric.   MOTOR:  Motor strength is 5/5 in all extremities, including left leg and hip flexors which was previously weak. No atrophy, fasciculations or abnormal movements.  No pronator drift.  Tone is normal.    MSRs:  Reflexes are 2+/4 throughout, except 1+ bilateral Achilles.  COORDINATION/GAIT:  Gait is narrow based, stable and unassisted.  He is easily able to stand up without pushing off with arms  Data: Labs 06/13/2017: CK 324*, SPEP with IFE no M protein, ACE 67 NCS/EMG of the legs 06/19/2017: 1. 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 2. 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 3. There is no evidence of a diffuse myopathy or sensorimotor polyneuropathy.   MRI lumbar spine 10/29/2017: 1.  Moderate left and mild-moderate foraminal stenosis at L5-S1, with severe facet arthropathy and moderate facet effusions 2.  Mild-moderate right foraminal stenosis at L4-5, with moderate facet arthropathy and moderate left facet effusion  IMPRESSION/PLAN: Lumbar spondylosis with radiculopathy affecting bilateral L5-S1 and right L4-5 nerve segments.  He has improved left foot strength with physical therapy and resolution of pain with ESI.  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  No evidence of myopathy on EMG.  Return to clinic in 4 months, or sooner as needed.     Thank you for allowing me to  participate in patient's care.  If I can answer any additional questions, I would be pleased to do so.    Sincerely,    Joel Mericle K. Posey Pronto, DO

## 2018-07-01 ENCOUNTER — Encounter: Payer: Self-pay | Admitting: Neurology

## 2018-07-01 ENCOUNTER — Ambulatory Visit: Payer: Medicare HMO | Admitting: Neurology

## 2018-07-01 VITALS — BP 120/78 | HR 65 | Ht 74.0 in | Wt 230.2 lb

## 2018-07-01 DIAGNOSIS — M5417 Radiculopathy, lumbosacral region: Secondary | ICD-10-CM | POA: Diagnosis not present

## 2018-07-01 NOTE — Patient Instructions (Signed)
Continue to your home exercises  Return to clinic as needed

## 2020-04-17 ENCOUNTER — Ambulatory Visit: Payer: Medicare HMO | Admitting: Neurology

## 2020-04-17 ENCOUNTER — Other Ambulatory Visit: Payer: Self-pay

## 2020-04-17 ENCOUNTER — Encounter: Payer: Self-pay | Admitting: Neurology

## 2020-04-17 VITALS — BP 129/84 | HR 85 | Ht 75.0 in | Wt 213.0 lb

## 2020-04-17 DIAGNOSIS — G44209 Tension-type headache, unspecified, not intractable: Secondary | ICD-10-CM

## 2020-04-17 DIAGNOSIS — M542 Cervicalgia: Secondary | ICD-10-CM | POA: Diagnosis not present

## 2020-04-17 NOTE — Patient Instructions (Signed)
Start out-patient neck physiotherapy  Continue tizanidine 4mg  twice daily  Return to clinic in 3 months
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
## 2020-06-05 ENCOUNTER — Ambulatory Visit: Payer: Medicare HMO | Admitting: Neurology

## 2020-07-21 ENCOUNTER — Other Ambulatory Visit: Payer: Self-pay

## 2020-07-21 ENCOUNTER — Ambulatory Visit: Payer: Medicare HMO | Admitting: Neurology

## 2020-07-21 ENCOUNTER — Encounter: Payer: Self-pay | Admitting: Neurology

## 2020-07-21 ENCOUNTER — Other Ambulatory Visit (INDEPENDENT_AMBULATORY_CARE_PROVIDER_SITE_OTHER): Payer: Medicare HMO

## 2020-07-21 VITALS — BP 139/74 | HR 67 | Ht 74.0 in | Wt 218.0 lb

## 2020-07-21 DIAGNOSIS — R413 Other amnesia: Secondary | ICD-10-CM

## 2020-07-21 LAB — VITAMIN B12: Vitamin B-12: 719 pg/mL (ref 211–911)

## 2020-07-21 LAB — TSH: TSH: 1.34 u[IU]/mL (ref 0.35–4.50)

## 2020-07-21 NOTE — Patient Instructions (Signed)
Check labs  MRI brain wo contrast  You have been referred for a neurocognitive evaluation (i.e., evaluation of memory and thinking abilities). Please bring someone with you to this appointment if possible, as it is helpful for the neuropsychologist to hear from both you and another adult who knows you well. Please bring eyeglasses and hearing aids if you wear them.    The evaluation will take approximately 3 hours and has two parts:   . The first part is a clinical interview with the neuropsychologist, Dr. Milbert Coulter or Dr. [name]. During the interview, the neuropsychologist will speak with you and the individual you brought to the appointment.    . The second part of the evaluation is testing with the doctor's technician, aka psychometrician, Annabelle Harman or [employer]. During the testing, the technician will ask you to remember different types of material, solve problems, and answer some questionnaires. Your family member will not be present for this portion of the evaluation.   Please note: We have to reserve several hours of the neuropsychologist's time and the psychometrician's time for your evaluation appointment. As such, there is a No-Show fee of $100. If you are unable to attend any of your appointments, please contact our office as soon as possible to reschedule.

## 2020-07-21 NOTE — Progress Notes (Signed)
    Follow-up Visit   Date: 07/21/20    Garrett Hogan MRN: [id number] DOB: 09-05-1945   Interim History: Garrett Hogan is a 75 y.o. right-handedAfrican Americanmalewith hypertension,hyperlipidemia, CAD, and GERDreturning to clinic with new complaints of memory changes.  The patient was accompanied to the clinic by self.  UPDATE 07/21/2020:  His neck pain significantly improved after completing physical therapy. Today, he reports new complaints of short-term memory loss which is worse over the past year. He finds himself always writing notes and appointments, whereas, he was previously able to remember things better. He is able to drive, manage finances, and medications without problems.     Medications:  Current Outpatient Medications on File Prior to Visit  Medication Sig Dispense Refill  . cloNIDine (CATAPRES) 0.2 MG tablet Take 0.2 mg by mouth 2 (two) times daily.    Marland Kitchen diltiazem (TIAZAC) 360 MG 24 hr capsule Take 360 mg by mouth at bedtime.  5  . tiZANidine (ZANAFLEX) 4 MG capsule Take 4 mg by mouth 3 (three) times daily as needed.    . naproxen sodium (ALEVE) 220 MG tablet Take 220 mg by mouth. (Patient not taking: No sig reported)     No current facility-administered medications on file prior to visit.    Allergies: No Known Allergies  Vital Signs:  BP 139/74   Pulse 67   Ht 6\' 2"  (1.88 m)   Wt 218 lb (98.9 kg)   SpO2 100%   BMI 27.99 kg/m   Neurological Exam: MENTAL STATUS including orientation to time, place, person, recent and remote memory, attention span and concentration, language, and fund of knowledge is fair.  Speech is not dysarthric. SLUMS 17/30.  CRANIAL NERVES:  Extraocular muscles intact. Face is symmetric.   MOTOR:  Motor strength is 5/5 in all extremities.  No pronator drift.  Tone is normal.     COORDINATION/GAIT:  Gait is narrow based, stable and unassisted.   Data: Labs 06/13/2017: CK 324*, SPEP with IFE no M protein, ACE  67 NCS/EMG of the legs 06/19/2017: 1. 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 2. 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 3. There is no evidence of a diffuse myopathy or sensorimotor polyneuropathy.   MRI lumbar spine 10/29/2017: 1.  Moderate left and mild-moderate foraminal stenosis at L5-S1, with severe facet arthropathy and moderate facet effusions 2.  Mild-moderate right foraminal stenosis at L4-5, with moderate facet arthropathy and moderate left facet effusion    IMPRESSION/PLAN: 1.  Cognitive impairment, amnestic only.  No problems with ADLs or IADLs.  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  He is not on many medications which would contribute to cognitive changes.   - Check vitamin B12, TSH  - MRI brain wo contrast  - Formal neurocognitive   2.  Cervicalgia - resolved with PT.  Return to clinic in 6 months   Thank you for allowing me to participate in patient's care.  If I can answer any additional questions, I would be pleased to do so.    Sincerely,    Alana Dayton K. 06-19-1968, DO

## 2020-07-24 ENCOUNTER — Telehealth: Payer: Self-pay

## 2020-07-24 NOTE — Telephone Encounter (Signed)
Called patient and informed him of normal labs. Patient verbalized understanding and had no questions or concerns.

## 2020-07-24 NOTE — Telephone Encounter (Signed)
-----   Message from Glendale Chard, DO sent at 07/24/2020  1:42 PM EST ----- Please notify patient lab are within normal limits.  Thank you.

## 2020-08-16 ENCOUNTER — Other Ambulatory Visit: Payer: Self-pay

## 2020-08-16 ENCOUNTER — Ambulatory Visit
Admission: RE | Admit: 2020-08-16 | Discharge: 2020-08-16 | Disposition: A | Discharge status: Home / Self Care | Payer: Medicare HMO | Source: Ambulatory Visit | Attending: Neurology | Admitting: Neurology

## 2020-08-16 DIAGNOSIS — R413 Other amnesia: Secondary | ICD-10-CM

## 2020-08-18 ENCOUNTER — Telehealth: Payer: Self-pay | Admitting: Neurology

## 2020-08-18 NOTE — Telephone Encounter (Signed)
Called and discussed the results of patients MRI brain:  No evidence of acute intracranial abnormality.  Mild cerebral atrophy without appreciable lobar predominance.  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 Consider CT or MR angiography for further evaluation.  Mild paranasal sinus disease as described.  I discussed getting CTA to better characterize his left vertebral artery, however, he prefers not to pursue additional testing.  I suggested that he start taking aspirin 81mg  daily.  Garrett Hogan K. , DO

## 2020-09-18 ENCOUNTER — Encounter: Payer: Self-pay | Admitting: Counselor

## 2020-09-18 ENCOUNTER — Ambulatory Visit (INDEPENDENT_AMBULATORY_CARE_PROVIDER_SITE_OTHER): Payer: Medicare HMO | Admitting: Counselor

## 2020-09-18 ENCOUNTER — Ambulatory Visit: Payer: Medicare HMO

## 2020-09-18 ENCOUNTER — Other Ambulatory Visit: Payer: Self-pay

## 2020-09-18 DIAGNOSIS — G3184 Mild cognitive impairment, so stated: Secondary | ICD-10-CM

## 2020-09-18 DIAGNOSIS — F09 Unspecified mental disorder due to known physiological condition: Secondary | ICD-10-CM

## 2020-09-18 NOTE — Progress Notes (Signed)
NEUROPSYCHOLOGICAL EVALUATION [facility]  Patient Name: Garrett Hogan MRN: [id number] Date of Birth: [birth year] Age: 75 y.o. Education: 14 years  Referral Circumstances and Background Information  Mr. Garrett Hogan is a 75 y.o., right-hand dominant, married man with a history of HTN, HLD, CAD, and cervicalgia that resolved following PT. He is a patient of Dr. Allena Katz who is treating him for the afore noted issue. He expressed concerns about his memory over the past year, obtained a 17/30 on the SLUMS, and was referred for neurocognitive evaluation in the service of better characterizing his current status.     On interview, the patient reported that he has been having difficulties with word retrieval, including recalling people's names and with word finding. He wasn't able to think of other things that he forgets and denied forgetting appointments (tracks them with his phone). He "sometimes" forgets things that his family members tell him but not that much. He thinks he is more concerned about his difficulties than those around him and denied that his wife or children have commented on or noticed his memory problems. He doesn't think his memory is impaired to such a degree that it prevents him from doing any of his day-to-day activities although he does think it is getting worse over the past year. On specific review of symptoms, he does not notice any problems with orientation but he acknowledged problems with attention and concentration. With respect to mood, he reported that it's "not excellent but good." He has 6 grandchildren and 7 great grandchildren and he worries about things going on in their life and they "cost him a fortune," which is also stressful. His daughter unfortunately died in an accident in September 25, 2018, she was very involved with her grandchildren, and so he has tried to pick up a significant amount of that slack. He is also helping to support the children financially,  covering their kindergarten tuition. With respect to sleep, he said that he sleeps well but then also said that he is only getting 6 hours of sleep sometimes and will wake up at 5am and can't go back to sleep. He has lost about 15lbs over the past year, he thinks he isn't eating what he used to, and he is not trying to lose weight. His energy is variable.   The patient is also concerned about a tremor today and changes in his handwriting, it "looks like a scribble," which is largely due to the tremor. He did appear to have some postural/action tremor in the R > L hand. He thinks it happens at rest "sometimes" although I did not notice that today. He denied any recent falls, significant balance problems, or gait changes.   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 He has about 15 employees who work with him. He estimated that he works about 7 hours a day, close to full time. He has no difficulties with functioning on the job related to his memory problems but also reported that most of the work he does is routine. He does handle the finances for the business, however, and denied any problems managing the books or other aspects of the business related to his memory problems. With respect to other areas of functioning, he denied that his memory problems are causing any difficulties with respect to driving, medication management, using the community such as to grocery shop, or the like.  Past Medical History and Review of Relevant Studies   Patient Active Problem List   Diagnosis Date Noted  . 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 Bilateral leg weakness 06/13/2017  . Obese 07/24/2016  . 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  It appears as though there is somewhat more in the posterior portions of the brain, in the parietal lobes bilaterally on axial cuts, although some of this may relate to cerebral morphology. Signal abnormality was also noted by radiology in the non dominant intracranial left vertebral artery, suggestive of high-grade stenosis or partial occlusion. There is a minimal to mild burden of leukoaraiosis, doubtful as a sole cause of significant cognitive problems given amount.    The patient has a remote history of head injury when he fell doing [employer] about 15 years ago, losing consciousness for a brief period of time. It doesn't sound like it was associated with any lasting cognitive difficulties.  Current Outpatient Medications  Medication Sig Dispense Refill  . cloNIDine (CATAPRES) 0.2 MG tablet Take 0.2 mg by mouth 2 (two) times daily.    Marland Kitchen diltiazem (TIAZAC) 360 MG 24 hr capsule Take 360 mg by mouth at bedtime.  5  . naproxen sodium (ALEVE) 220 MG tablet Take 220 mg by mouth. (Patient not taking: No sig reported)    . tiZANidine (ZANAFLEX) 4 MG capsule Take 4 mg by mouth 3 (three) times daily as needed.     No current facility-administered medications for this visit.   Family History  Problem Relation Age of Onset  . Heart disease Mother        Died at 75  . Hypertension Mother   . Heart attack Father        Died in 67s  . Cancer Father   . Lung cancer Sister   . Healthy Brother    There is a family history of dementia. His mother lived to be 35 and he thinks she had some symptoms in her 34s. There is no  family history of psychiatric illness. He does not recall any family history of tremor.   Psychosocial History  Developmental, Educational and Employment History: The patient is a native of [place]. He denied any history of abuse or neglect. He reported that he did not do well in school. He started working in the 6th grade and was always more focused on working than his  studies. At times, he had up to 3 part time jobs. Nevertheless, he denied repeating any grades and doesn't think he failed any classes. He earned mainly C's and B's. He went to college at [employer] and attempted to study Actuary but stated that he really wasn't prepared and didn't do well. He "struggled through those two years," failed some classes, and then decided to leave. 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 He has about 15 employees at present.     Psychiatric History: Denied by the patient.  Substance Use History: The patient essentially stopped drinking 30 years ago. He doesn't use illicit substances. He quit smoking over 30 years ago.   Relationship History and Living Cimcumstances: The patient and his wife have been married for 30 years. He was previously married for 22 years  and they got divorced. The patient has two children, a daughter who passed in 2020 and a son. He denied that his son has noticed any memory and thinking changes.    Mental Status and Behavioral Observations  Sensorium/Arousal: The patient's level of arousal was awake and alert. Hearing and vision were adequate for testing purposes. Orientation: The patient was alert and fully oriented to person, place, time, and situation.  Appearance: Dressed in appropriate, casual clothing with reasonable grooming and hygiene.  Behavior: The patient was pleasant and appropriate. Did appear to get overwhelmed and/or flustered during testing, which could have affected his performance.  Speech/language: Speech was normal in rate, rhythm, and volume. He did have occasional word finding difficulties and paraphasic errors.  Gait/Posture: Gait normal on serial exam with Dr. Allena Katz going back to 2019 Movement: I do not see that his tremor was evident on exam with Dr. Allena Katz. Subtle postural/action tremor noted today with hands outstretched, suggested he  mention it as a concern at his next neurology appointment.  Social Comportment: Pleasant, appropriate Mood: "Not excellent but good."  Affect: Euthymic Thought process/content: Thought process was logical and goal-oriented, he did get flustered at times during the testing.  Safety: No safety concerns identified in this euthymic patient Insight: Christin Bach Cognitive Assessment  09/18/2020  Visuospatial/ Executive (0/5) 3  Naming (0/3) 3  Attention: Read list of digits (0/2) 0  Attention: Read list of letters (0/1) 1  Attention: Serial 7 subtraction starting at 100 (0/3) 3  Language: Repeat phrase (0/2) 0  Language : Fluency (0/1) 0  Abstraction (0/2) 1  Delayed Recall (0/5) 0  Orientation (0/6) 6  Total 17  Adjusted Score (based on education) 17  Patient got 3/5 delayed recall with category cue and 5/5 with multiple choice.   Test Procedures  Wide Range Achievement Test - 4   Word Reading Wechsler Adult Intelligence Scale - IV  Digit Span  Arithmetic  Symbol Search  Coding Repeatable Battery for the Assessment of Neuropsychological Status (Form A) ACS Word Choice The Dot Counting Test Controlled Oral Word Association (F-A-S) Semantic Fluency (Animals) Trail Making Test A & B Wisconsin Card Sorting Test - 64 Geriatric Depression Scale - Short Form GAD-7  Plan  Garrett Hogan was seen for a psychiatric diagnostic evaluation and neuropsychological testing. He is a pleasant, 75 year old, right-hand dominant man with a history of memory and thinking changes over the past year or so. 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 He is noticing mainly word finding difficulties, problems with attentiveness, and difficulty recalling names. He has a history of diminished involvement in school and then did drop out of college because  it was too challenging, which may partially explain his mental status examination performance with 17/30 on the MoCA today and the same score on the SLUMS. He seemed to get flustered and/or overwhelmed at times during testing. Neuropsychological evaluation will be helpful to get at premorbid strengths and weaknesses and more fully assess his capacities. Full and complete note with impressions, recommendations, and interpretation of test data to follow.   Bettye Boeck Garrett Reno, PsyD, ABN Clinical Neuropsychologist  Informed Consent  Risks and benefits of the evaluation were discussed with the patient prior to all testing procedures. I conducted a clinical interview and neuropsychological testing (at least two tests) with Garrett Hogan and Garrett Hogan,  B.S. Radiographer, therapeutic(Technician) administered additional test procedures. The patient was able to tolerate the testing procedures and the patient (and/or family if applicable) is likely to benefit from further follow up to receive the diagnosis and treatment recommendations, which will be rendered at the next encounter.

## 2020-09-18 NOTE — Progress Notes (Signed)
   Psychometrist Note   Cognitive testing was administered to Garrett Hogan by Lamar Benes, B.S. (Technician) under the supervision of Alphonzo Severance, Psy.D., ABN. Garrett Hogan was able to tolerate all test procedures. Dr. Nicole Kindred met with the patient as needed to manage any emotional reactions to the testing procedures. Rest breaks were offered.    The battery of tests administered was selected by Dr. Nicole Kindred with consideration to the patient's current level of functioning, the nature of his symptoms, emotional and behavioral responses during the interview, level of literacy, observed level of motivation/effort, and the nature of the referral question. This battery was communicated to the psychometrist. Communication between Dr. Nicole Kindred and the psychometrist was ongoing throughout the evaluation and Dr. Nicole Kindred was immediately accessible at all times. Dr. Nicole Kindred provided supervision to the technician on the date of this service, to the extent necessary to assure the quality of all services provided.    Garrett Hogan will return in approximately one week for an interactive feedback session with Dr. Nicole Kindred, at which time test performance, clinical impressions, and treatment recommendations will be reviewed in detail. The patient understands he can contact our office should he require our assistance before this time.   A total of 100 minutes of billable time were spent with Garrett Hogan by the technician, including test administration and scoring time. Billing for these services is reflected in Dr. Les Pou note.   This note reflects time spent with the psychometrician and does not include test scores, clinical history, or any interpretations made by Dr. Nicole Kindred. The full report will follow in a separate note.

## 2020-09-18 NOTE — Progress Notes (Signed)
NEUROPSYCHOLOGICAL TEST SCORES [name]  Patient Name: Garrett Hogan MRN: [id number] Date of Birth: 1946-01-22 Age: 75 y.o. Education: 14 years  Measurement properties of test scores: IQ, Index, and Standard Scores (SS): Mean = 100; Standard Deviation = 15 Scaled Scores (Ss): Mean = 10; Standard Deviation = 3 Z scores (Z): Mean = 0; Standard Deviation = 1 T scores (T); Mean = 50; Standard Deviation = 10  TEST SCORES:    Note: This summary of test scores accompanies the interpretive report and should not be interpreted by unqualified individuals or in isolation without reference to the report. Test scores are relative to age, gender, and educational history as available and appropriate.   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      Expected Functioning        Wide Range Achievement Test (Word Reading): Standard/Scaled Score Percentile       Word Reading 107 68      Reynolds Intellectual Screening Test Standard/T-score Percentile      Guess What 39 14      Odd Item Out 49 46  RIST Index 92 30      Cognitive Testing        RBANS, Form : Standard/Scaled Score Percentile  Total Score 87 19  Immediate Memory 76 5      List Learning 3 1      Story Memory 8 25  Visuospatial/Constructional 102 55      Figure Copy   (19) 12 75      Judgment of Line Orientation   (16) --- 26-50  Language 82 12      Picture Naming --- 51-75      Semantic Fluency 3 1  Attention 88 21      Digit Span 4 2      Coding 12 75  Delayed Memory 107 68      List Recall   (3) ---  17-25      List Recognition   (19) --- 26-50      Story Recall   (7) 8 25      Figure Recall   (18) 14 91      Wechsler Adult Intelligence Scale - IV: Standard/Scaled Score Percentile  Working Memory Index 80 9      Digit Span 4 2          Digit Span Forward 5 5          Digit Span Backward 6 9          Digit Span Sequencing 6 9      Arithmetic 9 37  Processing Speed Index 94 34      Symbol Search 10 50      Coding 8 25      Neuropsychological Assessment Battery (Language Module): T-score Percentile      Naming   (27) 34 5  Verbal Fluency: T-score Percentile      Controlled Oral Word Association (F-A-S) 33 5      Semantic Fluency (Animals) 39 14      Trail Making Test: T-Score Percentile      Part A 43 25      Part B 53 62      Modified Wisconsin Card Sorting Test (MWCST): Standard/T-Score Percentile      Number of Categories Correct 43 25      Number of Perseverative Errors 51 54      Number of Total Errors 50 50      Percent Perseverative Errors 51 54  Executive Function Composite 95 37      Boston Diagnostic Aphasia Exam: Raw Score Scaled Score      Complex Ideational Material 9 5      Clock Drawing Raw Score Descriptor      Command 7 Mild Impairment      Rating Scales         Raw Score Descriptor  GAD-7 12 Moderate  Geriatric Depression Scale - Short Form 1 Negative      Clinical Dementia Rating Raw Score Descriptor      Sum of Boxes 0.5 Mild Cognitive Impairment      Global Score 0.5 MCI   [name] V. [name] PsyD, ABN Clinical Neuropsychologist

## 2020-09-20 NOTE — Progress Notes (Signed)
NEUROPSYCHOLOGICAL EVALUATION [facility]  Patient Name: Garrett Hogan MRN: [id number] Date of Birth: Jun 12, 1945 Age: 75 y.o. Education: 14 years  Clinical Impressions  Garrett Hogan is a 75 y.o., right-hand dominant, married man with a history of HTN, HLD, CAD and cervicalgia that resolved following a course of PT. He has been having thinking and memory problems over the past year and was referred by Dr. Allena Hogan for evaluation in the service of better characterizing his current cognitive status. Within the present evaluation, Garrett Hogan reported mainly word finding problems, difficulties with attention and concentration, and he also has some memory problems although it sounds like his memory problems are not the main issue. 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 He has an MRI brain that shows some mild volume loss, perhaps a bit more in the posterior portions of the brain, and minimal leukoaraiosis. 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   Neuropsychological testing shows evidence of less than expected performance on measures of memory encoding, digit repetition, and there were additional low scores on indicators of phonemic fluency and when reasoning with verbal information on the [employer]. These findings can be summed up as reflecting diminished cognitive efficiency and executive control, a nonspecific profile. I question if premorbid factors may not be at play as he has a history of difficulties in school and there is some evidence of weak verbal as compared to non-verbal capacities.  He does not have a memory storage problem and generally retained information well across time. He also did well on multiple measures of  processing speed. Primary executive test scores were mixed with difficulties on two measures and then intact performance on two other measures. He screened negative for the presence of depression and falls at no more than an MCI level as pertaining to the CDR.   Mr. Hogan is thus manifesting low test scores suggestive of some mild impairment in a nonspecific pattern, broadly implicating cognitive efficiency and executive control. His test data do not look like Alzheimers. Vascular causes of cognitive impairment can present similarly although he does not have much burden on his MRI. Consideration is also given to reversible causes, such as insufficient sleep and cumulative effects of stress and/or interference due to pain from his orthopedic issues. Will counsel him on mindfulness/stress reduction techniques, healthy lifestyle changes, and suggest that he return for reevaluation as needed in 1 to 2 years.   Diagnostic Impressions: Mild neurocognitive disorder  Recommendations to be discussed with patient  Your performance and presentation today were consistent with performance that is just below what I would expect for you on measures of memory encoding (I.e., getting information in), attention/concentration, and other measures of cognitive efficiency and executive control. 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   Executive control and cognitive efficiency problems are perhaps the most common cause of cognitive problems that I encounter in my clinical practice that are not due to an underlying neurologic cause, like a stroke, head injury, or degenerative condition. While the possibility of an underlying cause cannot be entirely excluded out  in your case, I do think treatment  should focus on healthy-lifestyle changes and reversible risk factors for cognitive decline at this time. This means things like making sure you are getting a good night's sleep, managing stress, and also healthy diet and exercise.   Difficulties of the sort that you describe and that you demonstrated on testing are often responsive to mindfulness. Most fundamentally, mindfulness involves taking a non-judgmental stance to the unfolding of the present moment and simply noticing your experience. When you are engaged in a task, this includes dedicating yourself to the task fully, without judgment, and simply redirecting yourself back to the task if you find your mind wandering. Various mindfulness practices including mindfulness based stress reduction, mindfulness medication, and the like have been shown to create subjective and objective improvements in cognitive function. You might consider picking up a copy of "Falling Awake: How to Practice Mindfulness in Everyday Life," by Diamantina MonksJohn Kabat Zinn, Ph.D., who is one of the best known and most respected authors on the subject.  There is now good quality evidence from at least one large scale study that a modified mediterranean diet may help slow cognitive decline. This is known as the "MIND" diet. The Mind diet is not so much a specific diet as it is a set of recommendations for things that you should and should not eat.   Foods that are ENCOURAGED on the MIND Diet:  Green, leafy vegetables: Aim for six or more servings per week. This includes kale, spinach, cooked greens and salads.  All other vegetables: Try to eat another vegetable in addition to the green leafy vegetables at least once a day. It is best to choose non-starchy vegetables because they have a lot of nutrients with a low number of calories.  Berries: Eat berries at least twice a week. There is a plethora of research on strawberries, and other berries such as blueberries, raspberries and blackberries  have also been found to have antioxidant and brain health benefits.  Nuts: Try to get five servings of nuts or more each week. The creators of the MIND diet don't specify what kind of nuts to consume, but it is probably best to vary the type of nuts you eat to obtain a variety of nutrients. Peanuts are a legume and do not fall into this category.  Olive oil: Use olive oil as your main cooking oil. There may be other heart-healthy alternatives such as algae oil, though there is not yet sufficient research upon which to base a formal recommendation.  Whole grains: Aim for at least three servings daily. Choose minimally processed grains like oatmeal, quinoa, brown rice, whole-wheat pasta and 100% whole-wheat bread.  Fish: Eat fish at least once a week. It is best to choose fatty fish like salmon, sardines, trout, tuna and mackerel for their high amounts of omega-3 fatty acids.  Beans: Include beans in at least four meals every week. This includes all beans, lentils and soybeans.  Poultry: Try to eat chicken or [place] at least twice a week. Note that fried chicken is not encouraged on the MIND diet.  Wine: Aim for no more than one glass of alcohol daily. Both red and white wine may benefit the brain. However, much research has focused on the red wine compound resveratrol, which may help protect against Alzheimer's disease.  Foods that are DISCOURAGED on the MIND Diet: Butter and margarine: Try to eat less than 1 tablespoon (about 14 grams) daily. Instead, try using olive oil as  your primary cooking fat, and dipping your bread in olive oil with herbs.  Cheese: The MIND diet recommends limiting your cheese consumption to less than once per week.  Red meat: Aim for no more than three servings each week. This includes all beef, pork, lamb and products made from these meats.  [place] food: The MIND diet highly discourages fried food, especially the kind from fast-food restaurants. Limit your consumption to less  than once per week.  Pastries and sweets: This includes most of the processed junk food and desserts you can think of. Ice cream, cookies, brownies, snack cakes, donuts, candy and more. Try to limit these to no more than four times a week.  There are few things as disruptive to brain functioning as not getting a good night's sleep. For sleep, I recommend against using medications, which can have lingering sedating effects on the brain and rob your brain of restful REM sleep. Instead, consider trying some of the following sleep hygiene recommendations. They may not work at once and may take effort, but the effort you spend is likely to be rewarded with better sleep eventually:  . Stick to a sleep schedule of the same bedtime and wake time even on the weekends, which can help to regulate your body's internal clock so that you fall asleep and stay asleep.  . Practice a relaxing bedtime ritual (conducted away from bright lights) which will help separate your sleep from stimulating activities and prepare your body to fall asleep when you go to bed.  . Avoid naps, especially in the afternoon.  . Evaluate your room and create conditions that will promote sleep such as keeping it cool (between 60 - 67 degrees), quiet, and free from any lights. Consider using blackout curtains, a "white noise" generator, or fan that will help mask any noises that might prevent you from going to sleep or awaken you during the night.  . Sleep on a comfortable mattress and pillows.  . Avoid bright light in the evening and excessive use of portable electronic devices right before bed that may contain light frequencies that can contribute to sleep problems.  . Avoid alcohol, cigarettes, or heavy meals in the evening. If you must eat, consume a light snack 45 minutes before bed.  . Use your bed only for sleep to strengthen the association between your bed and sleep.  . If you can't go to sleep within 30 minutes, go into another room  and do something relaxing until you feel tired. Then, come back and try to go to sleep again for 30 minutes and repeat until sleep is achieved.  . Some people find over the counter melatonin to be helpful for sleep, which you could discuss with a pharmacist or prescribing provider.   Some cognitive abilities (such as processing speed) naturally decline with age, but there are many things you can do to contribute to healthy cognitive aging. There is evidence from at least one study that a modified low carbohydrate mediterranean diet (the MIND-DASH) diet can contribute to healthy cognitive aging. There is also some evidence to suggest a beneficial effect of coffee (black coffee without sugar or other additives such as cream) for healthy aging. One glass of red wine a day may also contribute to healthy aging though at two drinks you lose all benefit and at three drinks it may be doing more harm than good. Staying active, mentally and physically, are also crucially important. One of the best ways to do this is  simply to stay active and engaged in your life, particularly with social activities. Challenging the mind and other cognitively stimulating activities are encouraged, consider learning a new hobby, reconnecting with old friends, reading an interesting and thought provoking book. It is not so much what you do that is important as it is that you enjoy it and stay at it.   Exercise is one of the best medicines for promoting health and maintaining cognitive fitness at all stages in life. Exercise probably has the largest documented effect on brain health and performance of any lifestyle intervention. Studies have shown that even previously sedentary individuals who start exercising as late as age 7 show a significant survival benefit as compared to their non-exercising peers. In the [place], the current guidelines are for 30 minutes of moderate exercise per day, but increasing your activity level less than  that may also be helpful. You do not have to get your 30 minutes of exercise in one shot and exercising for short periods of time spread throughout the day can be helpful. Go for several walks, learn to dance, or do something else you enjoy that gets your body moving. Of course, if you have an underlying medical condition or there is any question about whether it is safe for you to exercise, you should consult a medical treatment provider prior to beginning exercise.   Test Findings  Test scores are summarized in additional documentation associated with this encounter. Test scores are relative to age, gender, and educational history as available and appropriate. There were no concerns about performance validity as all findings fell within normal expectations.   General Intellectual Functioning/Achievement:  Performance on single word reading was average, toward the upper end of the average range. Hist RIST index was at the lower end of the average range, with low average (nearly unusually low) performance on the more verbally mediated portion and average performance on the more visually oriented portion. The findings suggest some weakness in premorbid verbal abilities that could be on the basis of education quality/quantity.   Attention and Processing Efficiency: Performance on indicators of attention and working memory fell at a low average level, albeit with low scores on measures of digit repetition suggesting some possible mild decline. Digit repetition forward, backward, and digit resequencing in ascending order were unusually low. Performance on timed indicators of processing speed was better, falling at an average level. Performance was average on two different measures of timed number-symbol coding. Efficient visual matching and efficient visual scanning were average on a symbol matching to sample task.   Language: Performance was unusually low on visual object confrontation naming. Generation of  words was unusually low in response to the letters F-A-S and was low average in response to the category prompt "animals." Fluency for fruits and vegetables was extremely low.   Visuospatial Function: This domain represented an area of strength for Mr. Lomas who scored at an average level. Copy of a modestly complex figure was average as was judgment of angular line orientations.   Learning and Memory: Performance on measures of learning and memory was below expectations with respect to encoding although he retained information well across time and delayed recall was one of his best performance domains. Accordingly, the profile suggests efficiency/encoding factors are at play and he does not have a storage problem. He seemed to do better learning structured as compared to unstructured information, which is often observed when executive abilities attenuate memory performance.   In the visual realm, Mr.  Roggenkamp recalled 3, 3, 4, and 5 words of a 10-item word list across four learning trials, which is extremely low. Following a standard delay, he retained 3 of the words from the list, which is low average to average. Recognition discrimination was better, however, with an average score. His memory for a short story was better still and fell in the average range.  He retained 7 of the 9 story details he had encoded on delayed recall.   Memory for visual information in the form of a modestly complex geometric figure was very good and in the superior range.   Executive Functions: Performance on executive indicators was mixed with an overall impression of some executive control difficulties considering his entire testing profile (e.g., better learning of structured as compared un unstructured information, encoding as opposed to storage problems, difficulties with digit repetition/working memory). He nevertheless performed adequately on the Modified [employer] test, which was average.  Generalion of words in response to the letters F-A-S was unusually low. Clock drawing was consistent with "mild impairment" with minor errors in numbering and stimulus bound hand placement.   Rating Scale(s): Mr. Cianci screened negative for the presence of depression. He does admit to a moderate level of anxiety symptoms and reported worrying too much about different things nearly every day, feeling nervous/anxious/on edge more than half the days, and not being able to stop or control worrying (more than half the days), and trouble relaxing (more than half the days) over the past two weeks.   Bettye Boeck [name], PsyD, ABN Clinical Neuropsychologist  Coding and Compliance  Billing below reflects technician time, my direct face-to-face time with the patient, time spent in test administration, and time spent in professional activities including but not limited to: neuropsychological test interpretation, integration of neuropsychological test data with clinical history, report preparation, treatment planning, care coordination, and review of diagnostically pertinent medical history or studies.   Services associated with this encounter: Clinical Interview [id number]) plus 160 minutes (96132/96133; Neuropsychological Evaluation by Professional)  25 minutes (96136/96137; Test Administration by Professional) 100 minutes (96138/96139; Neuropsychological Testing by Technician)

## 2020-09-25 ENCOUNTER — Ambulatory Visit: Payer: Medicare HMO | Admitting: Counselor

## 2020-09-25 ENCOUNTER — Other Ambulatory Visit: Payer: Self-pay

## 2020-09-25 ENCOUNTER — Encounter: Payer: Self-pay | Admitting: Counselor

## 2020-09-25 DIAGNOSIS — G3184 Mild cognitive impairment, so stated: Secondary | ICD-10-CM

## 2020-09-25 NOTE — Progress Notes (Signed)
   NEUROPSYCHOLOGY FEEDBACK NOTE [facility]  Feedback Note: I met with Garrett Hogan to review the findings resulting from his neuropsychological evaluation. Since the last appointment, he has been about the same. Time was spent reviewing the impressions and recommendations that are detailed in the evaluation report. We discussed impression of mild cognitive impairment, although in his case, I am not certain what this is due to or that it necessarily is neurodegenerative in nature. At this point I encouraged him to view it as a risk factor and something to monitor over time. We discussed diet, exercise, and sleep as things to focus on. Other topics of discussion as reflected in the patient instructions. I took time to explain the findings and answer all the patient's questions. I encouraged Garrett Hogan to contact me should he have any further questions or if further follow up is desired.   Current Medications and Medical History   Current Outpatient Medications  Medication Sig Dispense Refill  . cloNIDine (CATAPRES) 0.2 MG tablet Take 0.2 mg by mouth 2 (two) times daily.    Marland Kitchen diltiazem (TIAZAC) 360 MG 24 hr capsule Take 360 mg by mouth at bedtime.  5  . naproxen sodium (ALEVE) 220 MG tablet Take 220 mg by mouth. (Patient not taking: No sig reported)    . tiZANidine (ZANAFLEX) 4 MG capsule Take 4 mg by mouth 3 (three) times daily as needed.     No current facility-administered medications for this visit.    Patient Active Problem List   Diagnosis Date Noted  . 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 Bilateral leg weakness 06/13/2017  . Obese 07/24/2016  . 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 Speech was normal in rate, rhythm, volume, and prosody. Self-reported mood was  "good" and affect was euthymic. Thought process was logical and goal oriented, although he did lose his train of thought occasionally, and thought content was appropriate to the topics discussed. There were no safety concerns identified at today's encounter, such as thoughts of harming self or others.   Plan  Feedback provided regarding the patient's neuropsychological evaluation. He has mild neurocognitive disorder, although it is unclear to me if he is at risk of any decline, he will return for reevaluation in 1 to 2 years as needed. Garrett Hogan was encouraged to contact me if any questions arise or if further follow up is desired.  Garrett Hogan Nicole Kindred, PsyD, ABN Clinical Neuropsychologist  Service(s) Provided at This Encounter: 32 minutes [phone]; Psychotherapy with patient/family)

## 2020-09-25 NOTE — Patient Instructions (Addendum)
Your performance and presentation today were consistent with performance that is just below what I would expect for you on measures of memory encoding (I.e., getting information in), attention/concentration, and other measures of cognitive efficiency and executive control. 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   Executive control and cognitive efficiency problems are perhaps the most common cause of cognitive problems that I encounter in my clinical practice that are not due to an underlying neurologic cause, like a stroke, head injury, or degenerative condition. While the possibility of an underlying cause cannot be entirely excluded out in your case, I do think treatment should focus on healthy-lifestyle changes and reversible risk factors for cognitive decline at this time. This means things like making sure you are getting a good night's sleep, managing stress, and also healthy diet and exercise.   Difficulties of the sort that you describe and that you demonstrated on testing are often responsive to mindfulness. Most fundamentally, mindfulness involves taking a non-judgmental stance to the unfolding of the present moment and simply noticing your experience. When you are engaged in a task, this includes dedicating yourself to the task fully, without judgment, and simply redirecting yourself back to the task if you find your mind wandering. Various mindfulness practices including mindfulness based stress reduction, mindfulness medication, and the like have been shown to create subjective and objective improvements in cognitive function. You might consider picking up a copy of "Falling Awake: How to Practice Mindfulness in Everyday Life," by Diamantina Monks, Ph.D., who is one of the  best known and most respected authors on the subject.  There is now good quality evidence from at least one large scale study that a modified mediterranean diet may help slow cognitive decline. This is known as the "MIND" diet. The Mind diet is not so much a specific diet as it is a set of recommendations for things that you should and should not eat.   Foods that are ENCOURAGED on the MIND Diet:  Green, leafy vegetables: Aim for six or more servings per week. This includes kale, spinach, cooked greens and salads.  All other vegetables: Try to eat another vegetable in addition to the green leafy vegetables at least once a day. It is best to choose non-starchy vegetables because they have a lot of nutrients with a low number of calories.  Berries: Eat berries at least twice a week. There is a plethora of research on strawberries, and other berries such as blueberries, raspberries and blackberries have also been found to have antioxidant and brain health benefits.  Nuts: Try to get five servings of nuts or more each week. The creators of the MIND diet don't specify what kind of nuts to consume, but it is probably best to vary the type of nuts you eat to obtain a variety of nutrients. Peanuts are a legume and do not fall into this category.  Olive oil: Use olive oil as your main cooking oil. There may be other heart-healthy alternatives such as algae oil, though there is not yet sufficient research upon which to base a formal recommendation.  Whole grains: Aim for at least three servings daily. Choose minimally processed grains like oatmeal, quinoa, brown rice, whole-wheat pasta and 100% whole-wheat bread.  Fish: Eat fish at least once a week.  It is best to choose fatty fish like salmon, sardines, trout, tuna and mackerel for their high amounts of omega-3 fatty acids.  Beans: Include beans in at least four meals every week. This includes all beans, lentils and soybeans.  Poultry: Try to eat chicken or  [place] at least twice a week. Note that fried chicken is not encouraged on the MIND diet.  Wine: Aim for no more than one glass of alcohol daily. Both red and white wine may benefit the brain. However, much research has focused on the red wine compound resveratrol, which may help protect against Alzheimer's disease.  Foods that are DISCOURAGED on the MIND Diet: Butter and margarine: Try to eat less than 1 tablespoon (about 14 grams) daily. Instead, try using olive oil as your primary cooking fat, and dipping your bread in olive oil with herbs.  Cheese: The MIND diet recommends limiting your cheese consumption to less than once per week.  Red meat: Aim for no more than three servings each week. This includes all beef, pork, lamb and products made from these meats.  [place] food: The MIND diet highly discourages fried food, especially the kind from fast-food restaurants. Limit your consumption to less than once per week.  Pastries and sweets: This includes most of the processed junk food and desserts you can think of. Ice cream, cookies, brownies, snack cakes, donuts, candy and more. Try to limit these to no more than four times a week.  There are few things as disruptive to brain functioning as not getting a good night's sleep. For sleep, I recommend against using medications, which can have lingering sedating effects on the brain and rob your brain of restful REM sleep. Instead, consider trying some of the following sleep hygiene recommendations. They may not work at once and may take effort, but the effort you spend is likely to be rewarded with better sleep eventually:   Stick to a sleep schedule of the same bedtime and wake time even on the weekends, which can help to regulate your body's internal clock so that you fall asleep and stay asleep.   Practice a relaxing bedtime ritual (conducted away from bright lights) which will help separate your sleep from stimulating activities and prepare your  body to fall asleep when you go to bed.   Avoid naps, especially in the afternoon.   Evaluate your room and create conditions that will promote sleep such as keeping it cool (between 60 - 67 degrees), quiet, and free from any lights. Consider using blackout curtains, a "white noise" generator, or fan that will help mask any noises that might prevent you from going to sleep or awaken you during the night.   Sleep on a comfortable mattress and pillows.   Avoid bright light in the evening and excessive use of portable electronic devices right before bed that may contain light frequencies that can contribute to sleep problems.   Avoid alcohol, cigarettes, or heavy meals in the evening. If you must eat, consume a light snack 45 minutes before bed.   Use your bed only for sleep to strengthen the association between your bed and sleep.   If you can't go to sleep within 30 minutes, go into another room and do something relaxing until you feel tired. Then, come back and try to go to sleep again for 30 minutes and repeat until sleep is achieved.   Some people find over the counter melatonin to be helpful for sleep, which you could discuss with  a pharmacist or prescribing provider.   Some cognitive abilities (such as processing speed) naturally decline with age, but there are many things you can do to contribute to healthy cognitive aging. There is evidence from at least one study that a modified low carbohydrate mediterranean diet (the MIND-DASH) diet can contribute to healthy cognitive aging. There is also some evidence to suggest a beneficial effect of coffee (black coffee without sugar or other additives such as cream) for healthy aging. One glass of red wine a day may also contribute to healthy aging though at two drinks you lose all benefit and at three drinks it may be doing more harm than good. Staying active, mentally and physically, are also crucially important. One of the best ways to do this is  simply to stay active and engaged in your life, particularly with social activities. Challenging the mind and other cognitively stimulating activities are encouraged, consider learning a new hobby, reconnecting with old friends, reading an interesting and thought provoking book. It is not so much what you do that is important as it is that you enjoy it and stay at it.   Exercise is one of the best medicines for promoting health and maintaining cognitive fitness at all stages in life. Exercise probably has the largest documented effect on brain health and performance of any lifestyle intervention. Studies have shown that even previously sedentary individuals who start exercising as late as age 38 show a significant survival benefit as compared to their non-exercising peers. In the [place], the current guidelines are for 30 minutes of moderate exercise per day, but increasing your activity level less than that may also be helpful. You do not have to get your 30 minutes of exercise in one shot and exercising for short periods of time spread throughout the day can be helpful. Go for several walks, learn to dance, or do something else you enjoy that gets your body moving. Of course, if you have an underlying medical condition or there is any question about whether it is safe for you to exercise, you should consult a medical treatment provider prior to beginning exercise. 3

## 2021-04-12 ENCOUNTER — Other Ambulatory Visit: Payer: Self-pay | Admitting: Chiropractic Medicine

## 2021-04-12 DIAGNOSIS — M5459 Other low back pain: Secondary | ICD-10-CM

## 2021-05-09 ENCOUNTER — Other Ambulatory Visit: Payer: Self-pay

## 2021-05-09 ENCOUNTER — Ambulatory Visit
Admission: RE | Admit: 2021-05-09 | Discharge: 2021-05-09 | Disposition: A | Discharge status: Home / Self Care | Payer: Medicare HMO | Source: Ambulatory Visit | Attending: Chiropractic Medicine | Admitting: Chiropractic Medicine

## 2021-05-09 DIAGNOSIS — M5459 Other low back pain: Secondary | ICD-10-CM

## 2021-05-10 IMAGING — MR MR HEAD W/O CM
10 series · 48 of 48 positions shown · non-contrast
Comparison: Report from head CT 06/13/2011 (images unavailable).

CLINICAL DATA: Memory loss.

EXAM:
MRI HEAD WITHOUT CONTRAST
TECHNIQUE: Multiplanar, multiecho pulse sequences of the brain and surrounding
structures were obtained without intravenous contrast.

[Series 2: t1_se_sag · sagittal · 5.0mm · 0.47mm/px · 2 of 21 slices shown]
[im 1/21]
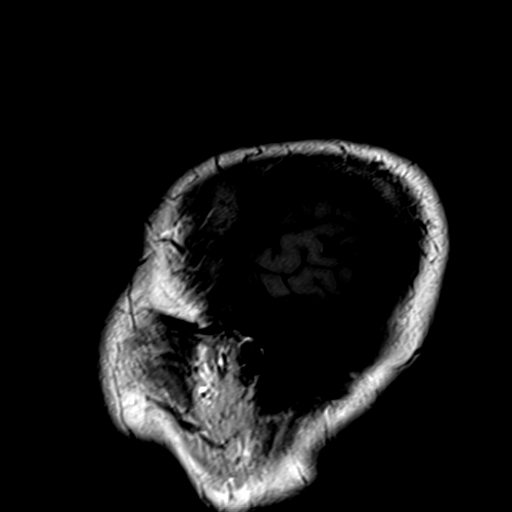
[im 21/21]
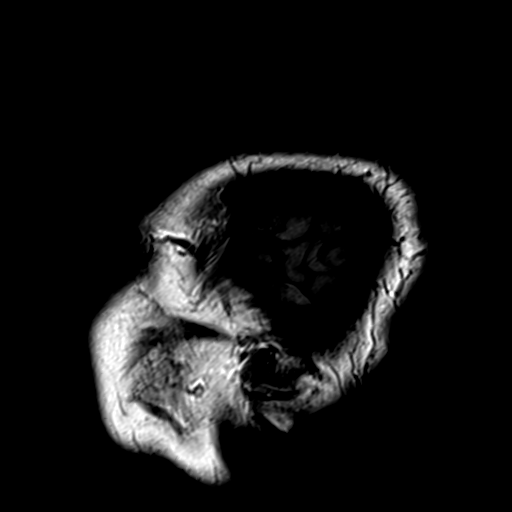

[Series 3: ep2d_diff_3 · axial · 3.0mm · 1.80mm/px · z∈[-46,+93]mm · 8 of 92 slices shown]
[im 1/92]
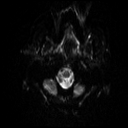
[im 14/92]
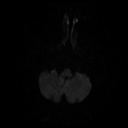
[im 27/92]
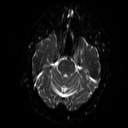
[im 40/92]
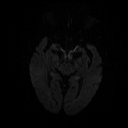
[im 53/92]
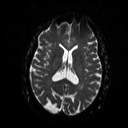
[im 66/92]
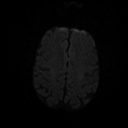
[im 79/92]
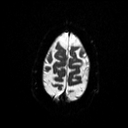
[im 92/92]
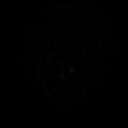

[Series 4: ep2d_diff_3_adc · axial · 3.0mm · 1.80mm/px · z∈[-46,+93]mm · 4 of 45 slices shown]
[im 1/45]
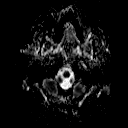
[im 15/45]
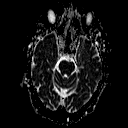
[im 30/45]
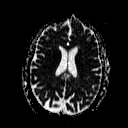
[im 45/45]
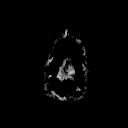

[Series 5: ep2d_diff_cor · coronal · 5.0mm · 1.77mm/px · 4 of 48 slices shown]
[im 1/48]
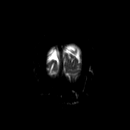
[im 16/48]
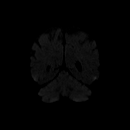
[im 32/48]
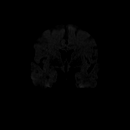
[im 48/48]
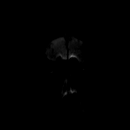

[Series 6: ep2d_diff_cor_adc · coronal · 5.0mm · 1.77mm/px · 2 of 24 slices shown]
[im 1/24]
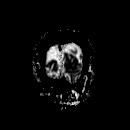
[im 24/24]
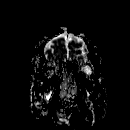

[Series 8: swi_images · axial · 2.0mm · 0.90mm/px · z∈[-51,+105]mm · 7 of 80 slices shown]
[im 1/80]
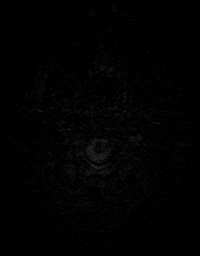
[im 14/80]
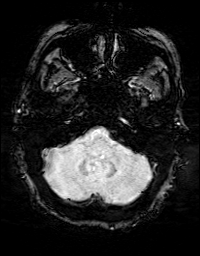
[im 27/80]
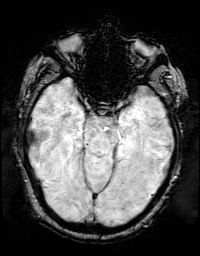
[im 40/80]
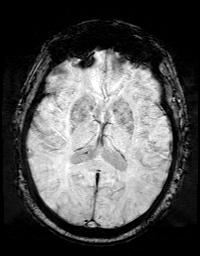
[im 53/80]
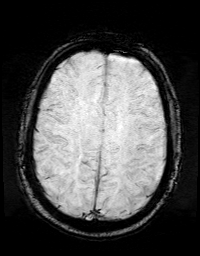
[im 66/80]
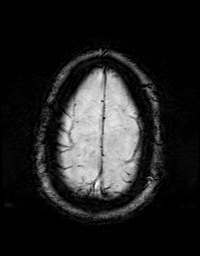
[im 80/80]
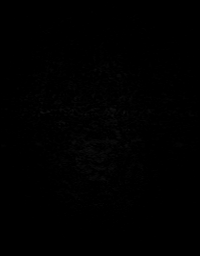

[Series 9: FLAIR · axial · 3.0mm · 0.43mm/px · z∈[-38,+90]mm · 3 of 34 slices shown]
[im 1/34]
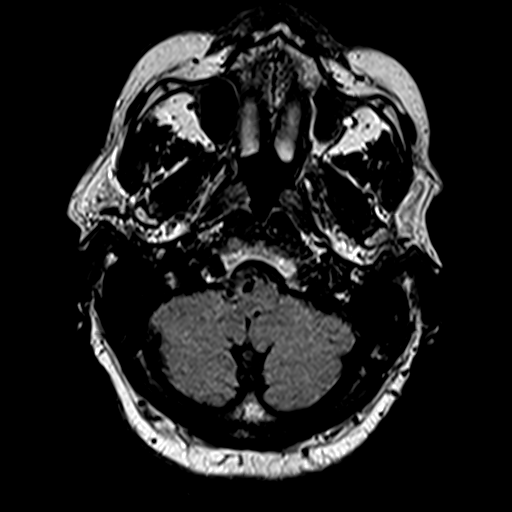
[im 17/34]
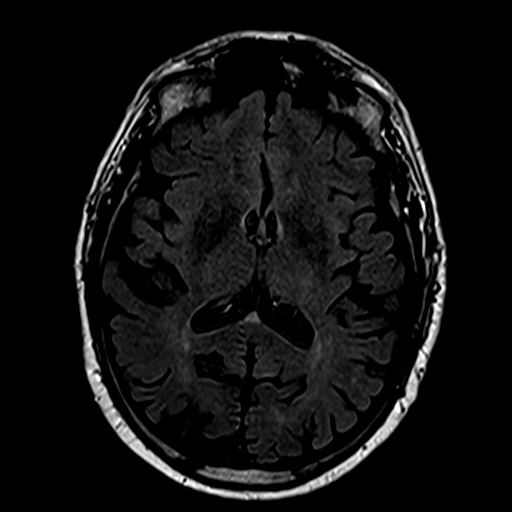
[im 34/34]
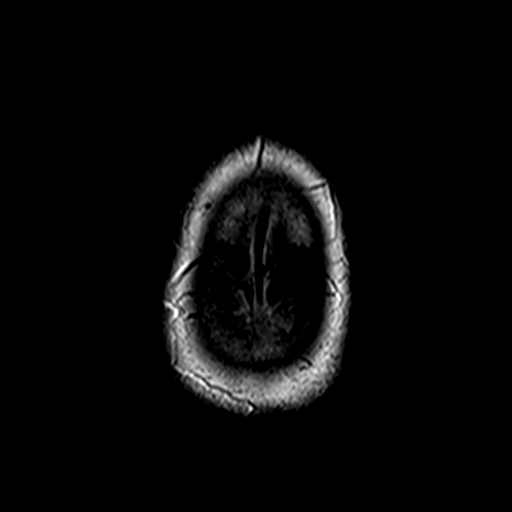

[Series 10: t2_tse_tra_512 · axial · 5.0mm · 0.60mm/px · z∈[-53,+107]mm · 2 of 28 slices shown]
[im 1/28]
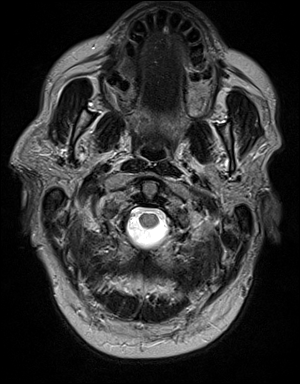
[im 28/28]
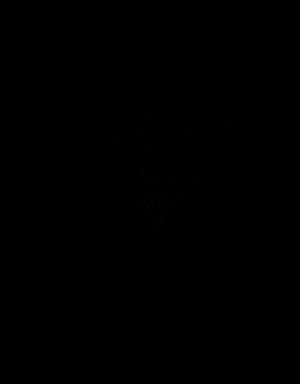

[Series 11: t1_mpr_tra · axial · 1.0mm · 0.72mm/px · z∈[-72,+86]mm · 14 of 160 slices shown]
[im 1/160]
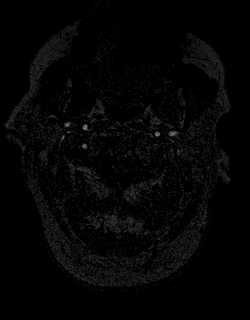
[im 13/160]
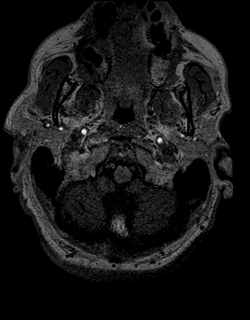
[im 25/160]
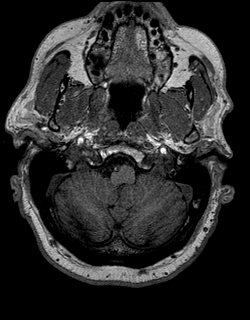
[im 37/160]
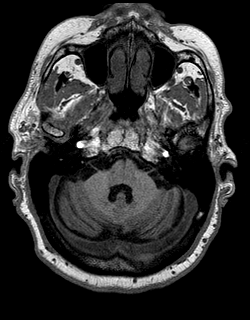
[im 49/160]
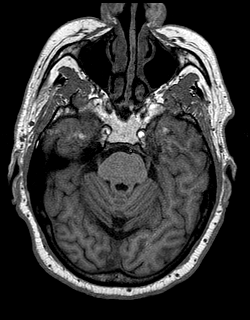
[im 62/160]
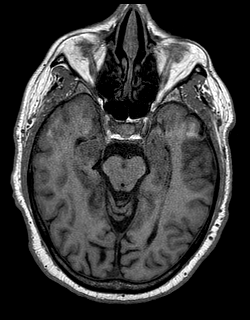
[im 74/160]
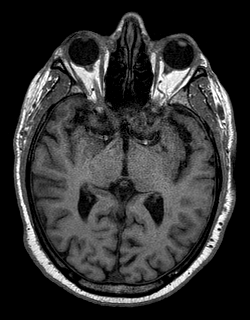
[im 86/160]
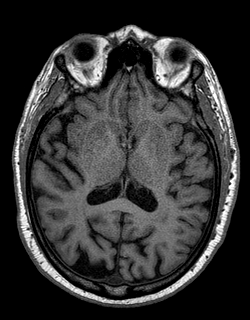
[im 98/160]
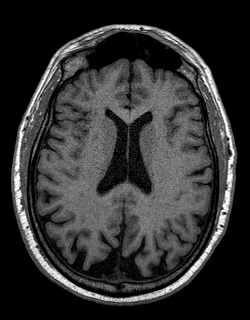
[im 111/160]
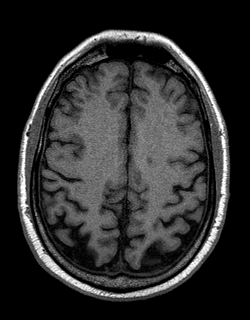
[im 123/160]
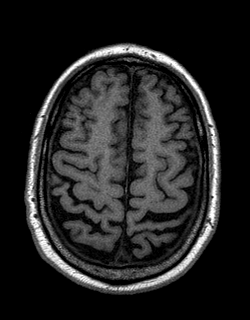
[im 135/160]
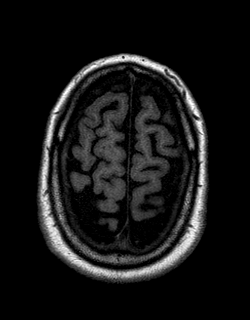
[im 147/160]
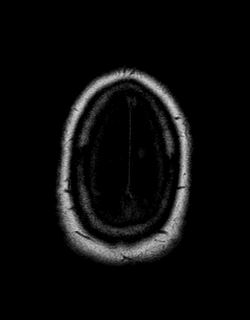
[im 160/160]
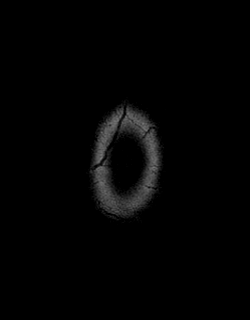

[Series 12: T2 · coronal · 5.0mm · 0.45mm/px · 2 of 26 slices shown]
[im 1/26]
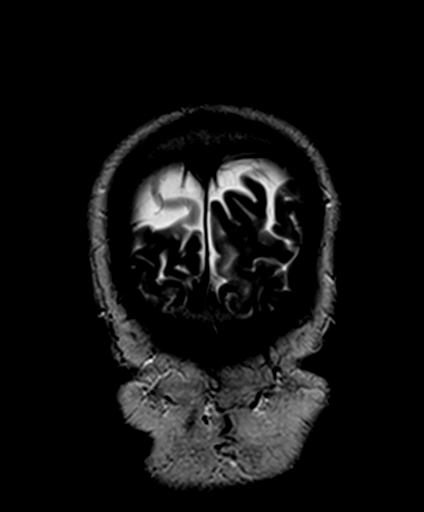
[im 26/26]
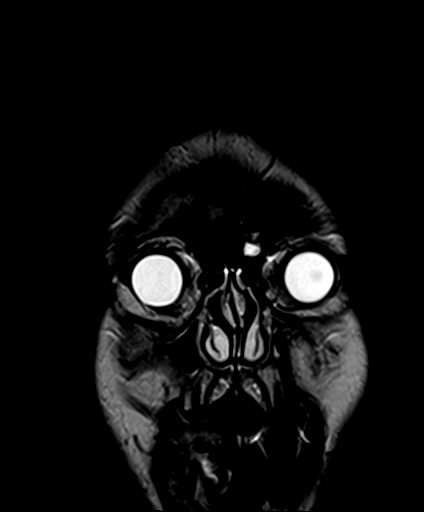

[48 of 48 positions shown; findings below may reference images not displayed]

FINDINGS: Brain:

Mild cerebral atrophy without appreciable lobar predominance.

Mild multifocal T2/FLAIR hyperintensity within the cerebral white
matter is nonspecific, but compatible with chronic small vessel
ischemic disease.

There is no acute infarct.

No evidence of intracranial mass.

No chronic intracranial blood products.

No extra-axial fluid collection.

No evidence of hydrocephalus.

No midline shift.

Vascular: Signal abnormality within the non dominant intracranial
left vertebral artery suggestive of high-grade stenosis or partial
occlusion. Otherwise there are expected proximal arterial flow
voids.

Skull and upper cervical spine: No focal marrow lesion.

Sinuses/Orbits: Visualized orbits show no acute finding. Small left
frontal sinus mucous retention cyst. Trace bilateral ethmoid and
right maxillary sinus mucosal thickening.
IMPRESSION: No evidence of acute intracranial abnormality.

Mild cerebral atrophy without appreciable lobar predominance.

Mild cerebral white matter chronic small vessel ischemic disease.

Signal abnormality within the non dominant intracranial left
vertebral artery suggestive of high-grade stenosis or partial
occlusion. Consider CT or MR angiography for further evaluation.

Mild paranasal sinus disease as described.

## 2021-05-23 ENCOUNTER — Ambulatory Visit: Payer: Medicare HMO | Admitting: Internal Medicine

## 2021-05-23 ENCOUNTER — Encounter: Payer: Self-pay | Admitting: Internal Medicine

## 2021-05-23 ENCOUNTER — Other Ambulatory Visit: Payer: Self-pay

## 2021-05-23 DIAGNOSIS — R002 Palpitations: Secondary | ICD-10-CM | POA: Diagnosis not present

## 2021-05-23 DIAGNOSIS — I495 Sick sinus syndrome: Secondary | ICD-10-CM | POA: Insufficient documentation

## 2021-05-23 NOTE — Progress Notes (Signed)
HPI  No Known Allergies   Current Outpatient Medications  Medication Sig Dispense Refill   cloNIDine (CATAPRES) 0.2 MG tablet Take 0.2 mg by mouth 2 (two) times daily.     diltiazem (TIAZAC) 360 MG 24 hr capsule Take 360 mg by mouth at bedtime.  5   naproxen sodium (ALEVE) 220 MG tablet Take 220 mg by mouth. (Patient not taking: No sig reported)     tiZANidine (ZANAFLEX) 4 MG capsule Take 4 mg by mouth 3 (three) times daily as needed.     No current facility-administered medications for this visit.     Past Medical History:  Diagnosis Date   Arthritis    BPH (benign prostatic hyperplasia)    CAD (coronary artery disease)    CKD (chronic kidney disease), stage III (HCC)    Coronary artery stenosis    GERD (gastroesophageal reflux disease)    Headache    Hepatitis    hx of Hep C - 1970s    Hypercholesteremia    Hypertension    LVH (left ventricular hypertrophy)    Murmur, cardiac     ROS:   All systems reviewed and negative except as noted in the HPI.   Past Surgical History:  Procedure Laterality Date   CARDIOVASCULAR STRESS TEST  2014   PARTIAL KNEE ARTHROPLASTY Right 10/10/2014   Procedure: RIGHT UNI-KNEE ARTHROPLASTY MEDIALLY;  Surgeon: Durene Romans, MD;  Location: [facility];  Service: Orthopedics;  Laterality: Right;   PARTIAL KNEE ARTHROPLASTY Left 07/23/2016   Procedure: LEFT MEDIALLY UNICOMPARTMENTAL KNEE;  Surgeon: Durene Romans, MD;  Location: [facility];  Service: Orthopedics;  Laterality: Left;   TONSILLECTOMY     VARICOSE VEIN SURGERY     left leg      Family History  Problem Relation Age of Onset   Heart disease Mother        Died at 69   Hypertension Mother    Heart attack Father        Died in 34s   Cancer Father    Lung cancer Sister    Healthy Brother      Social History   Socioeconomic History   Marital status: Married    Spouse name: Antonietta Jewel   Number of children: 2   Years of education: 14   Highest education level: Not on file   Occupational History   Not on file  Tobacco Use   Smoking status: Former    Packs/day: 0.50    Years: 30.00    Pack years: 15.00    Types: Cigarettes    Quit date: 1988    Years since quitting: 34.9   Smokeless tobacco: Never  Vaping Use   Vaping Use: Never used  Substance and Sexual Activity   Alcohol use: Yes    Comment: drinks wine once per month    Drug use: No   Sexual activity: Not on file  Other Topics Concern   Not on file  Social History Narrative   Lives with wife in a 2 story home.  Has 2 children.     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    Right Handed   Social Determinants of Health   Financial Resource Strain: Not on file  Food Insecurity: Not on file  Transportation Needs: Not on file  Physical Activity: Not on file  Stress: Not on file  Social Connections: Not on file  Intimate Partner Violence:  Not on file     There were no vitals taken for this visit.  Physical Exam:  Well appearing NAD HEENT: Unremarkable Neck:  No JVD, no thyromegally Lymphatics:  No adenopathy Back:  No CVA tenderness Lungs:  Clear HEART:  Regular rate rhythm, no murmurs, no rubs, no clicks Abd:  soft, positive bowel sounds, no organomegally, no rebound, no guarding Ext:  2 plus pulses, no edema, no cyanosis, no clubbing Skin:  No rashes no nodules Neuro:  CN II through XII intact, motor grossly intact  EKG  DEVICE  Normal device function.  See PaceArt for details.   Assess/Plan:

## 2021-05-23 NOTE — Patient Instructions (Addendum)
Medication Instructions:  Your physician recommends that you continue on your current medications as directed. Please refer to the Current Medication list given to you today.  Labwork: None ordered.  Testing/Procedures: None ordered.  Follow-Up: Your physician wants you to follow-up in: as needed with Gregg Taylor, MD    Any Other Special Instructions Will Be Listed Below (If Applicable).  If you need a refill on your cardiac medications before your next appointment, please call your pharmacy.       

## 2021-05-29 NOTE — Progress Notes (Signed)
HPI Garrett Hogan is referred by Dr. 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 The patient is a very pleasant 75 yo man with a h/o HTN who has a h/o palpitations who wore a cardiac monitor who had a pause of 3.7 seconds which occurred while he was sleeping. He denies a h/o syncope. He has not had chest pain. He had been on diltiazem and clonidine and was switched to amlodipine and HCTZ. He feels better. His heart monitor also showed NSVT. He has noted that since he switched to medical therapy with amlodipine and HCTZ and he thinks he feels better.   No Known Allergies   Current Outpatient Medications  Medication Sig Dispense Refill   amLODipine (NORVASC) 10 MG tablet Take 10 mg by mouth daily.     MYRBETRIQ 50 MG TB24 tablet Take 50 mg by mouth daily.     naproxen sodium (ALEVE) 220 MG tablet Take 220 mg by mouth.     pantoprazole (PROTONIX) 40 MG tablet Take 40 mg by mouth daily.     No current facility-administered medications for this visit.     Past Medical History:  Diagnosis Date   Arthritis    BPH (benign prostatic hyperplasia)    CAD (coronary artery disease)    CKD (chronic kidney disease), stage III (HCC)    Coronary artery stenosis    GERD (gastroesophageal reflux disease)    Headache    Hepatitis    hx of Hep C - 1970s    Hypercholesteremia    Hypertension    LVH (left ventricular hypertrophy)    Murmur, cardiac     ROS:   All systems reviewed and negative except as noted in the HPI.   Past Surgical History:  Procedure Laterality Date   CARDIOVASCULAR STRESS TEST  2014   PARTIAL KNEE ARTHROPLASTY Right 10/10/2014   Procedure: RIGHT UNI-KNEE ARTHROPLASTY MEDIALLY;  Surgeon: Durene Romans, MD;  Location: [facility];  Service: Orthopedics;  Laterality: Right;   PARTIAL KNEE ARTHROPLASTY Left 07/23/2016   Procedure: LEFT MEDIALLY UNICOMPARTMENTAL KNEE;  Surgeon: Durene Romans, MD;  Location: [facility];  Service: Orthopedics;  Laterality: Left;    TONSILLECTOMY     VARICOSE VEIN SURGERY     left leg      Family History  Problem Relation Age of Onset   Heart disease Mother        Died at 70   Hypertension Mother    Heart attack Father        Died in 55s   Cancer Father    Lung cancer Sister    Healthy Brother      Social History   Socioeconomic History   Marital status: Married    Spouse name: Antonietta Jewel   Number of children: 2   Years of education: 14   Highest education level: Not on file  Occupational History   Not on file  Tobacco Use   Smoking status: Former    Packs/day: 0.50    Years: 30.00    Pack years: 15.00    Types: Cigarettes    Quit date: 1988    Years since quitting: 35.0   Smokeless tobacco: Never  Vaping Use   Vaping Use: Never used  Substance and Sexual Activity   Alcohol use: Yes    Comment: drinks wine once per month    Drug use: No   Sexual activity: Not on file  Other Topics Concern   Not on file  Social  History Narrative   Lives with wife in a 2 story home.  Has 2 children.     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    Right Handed   Social Determinants of Health   Financial Resource Strain: Not on file  Food Insecurity: Not on file  Transportation Needs: Not on file  Physical Activity: Not on file  Stress: Not on file  Social Connections: Not on file  Intimate Partner Violence: Not on file     BP 140/72    Pulse 84    Ht 6\' 2"  (1.88 m)    Wt 213 lb 6.4 oz (96.8 kg)    SpO2 99%    BMI 27.40 kg/m   Physical Exam:  Well appearing NAD HEENT: Unremarkable Neck:  No JVD, no thyromegally Lymphatics:  No adenopathy Back:  No CVA tenderness Lungs:  Clear with no wheezes HEART:  Regular rate rhythm, 2/6 systolic murmur, no rubs, no clicks Abd:  soft, positive bowel sounds, no organomegally, no rebound, no guarding Ext:  2 plus pulses, no edema, no cyanosis, no clubbing Skin:  No rashes no nodules Neuro:  CN II through XII intact,  motor grossly intact  EKG- nsr  Assess/Plan:  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 His pause was while asleep. He has not had any symptoms. He feels better since his meds were adjusted. I have recommended watchful waiting. NO indication for PPM insertion.  HTN - his bp has been controlled since he was switched from diltiazem and clonidine to HCTZ and amlodipine.  AS - this is mild be echo.   Kaylyne Axton,MD

## 2022-09-22 ENCOUNTER — Other Ambulatory Visit: Payer: Self-pay

## 2022-09-22 ENCOUNTER — Encounter (HOSPITAL_BASED_OUTPATIENT_CLINIC_OR_DEPARTMENT_OTHER): Payer: Self-pay | Admitting: Emergency Medicine

## 2022-09-22 ENCOUNTER — Emergency Department (HOSPITAL_BASED_OUTPATIENT_CLINIC_OR_DEPARTMENT_OTHER)
Admission: EM | Admit: 2022-09-22 | Discharge: 2022-09-22 | Disposition: A | Discharge status: Home / Self Care | Payer: Medicare HMO | Attending: Emergency Medicine | Admitting: Emergency Medicine

## 2022-09-22 ENCOUNTER — Emergency Department (HOSPITAL_BASED_OUTPATIENT_CLINIC_OR_DEPARTMENT_OTHER): Payer: Medicare HMO

## 2022-09-22 DIAGNOSIS — J069 Acute upper respiratory infection, unspecified: Secondary | ICD-10-CM | POA: Diagnosis not present

## 2022-09-22 DIAGNOSIS — Z79899 Other long term (current) drug therapy: Secondary | ICD-10-CM | POA: Diagnosis not present

## 2022-09-22 DIAGNOSIS — R059 Cough, unspecified: Secondary | ICD-10-CM | POA: Diagnosis present

## 2022-09-22 DIAGNOSIS — I129 Hypertensive chronic kidney disease with stage 1 through stage 4 chronic kidney disease, or unspecified chronic kidney disease: Secondary | ICD-10-CM | POA: Insufficient documentation

## 2022-09-22 DIAGNOSIS — R600 Localized edema: Secondary | ICD-10-CM | POA: Diagnosis not present

## 2022-09-22 DIAGNOSIS — N189 Chronic kidney disease, unspecified: Secondary | ICD-10-CM | POA: Insufficient documentation

## 2022-09-22 DIAGNOSIS — Z1152 Encounter for screening for COVID-19: Secondary | ICD-10-CM | POA: Diagnosis not present

## 2022-09-22 LAB — RESP PANEL BY RT-PCR (RSV, FLU A&B, COVID)  RVPGX2
Influenza A by PCR: NEGATIVE
Influenza B by PCR: NEGATIVE
Resp Syncytial Virus by PCR: NEGATIVE
SARS Coronavirus 2 by RT PCR: NEGATIVE

## 2022-09-22 MED ORDER — ALBUTEROL SULFATE HFA 108 (90 BASE) MCG/ACT IN AERS: 2.0000 | INHALATION_SPRAY | RESPIRATORY_TRACT | Status: DC | PRN

## 2022-09-22 MED ORDER — BENZONATATE 100 MG PO CAPS: 100.0000 mg | ORAL_CAPSULE | Freq: Three times a day (TID) | ORAL | 0 refills | Status: DC

## 2022-09-22 NOTE — ED Provider Notes (Signed)
[place] [facility] AT [facility] Provider Note   CSN: [id number] Arrival date & time: 09/22/22  7829     History  Chief Complaint  Patient presents with   Cough    Garrett Hogan is a 77 y.o. male.  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  She presents started 5 days ago.  No fevers.  No ear pain.  He has had some nasal congestion.  No significant lower extremity swelling.  No chest pain or shortness of breath with exertion.  Patient did complete a trial of Paxlovid but has not had testing for COVID.       Home Medications Prior to Admission medications   Medication Sig Start Date End Date Taking? Authorizing Provider  amLODipine (NORVASC) 10 MG tablet Take 10 mg by mouth daily. 05/22/21   [provider]  MYRBETRIQ 50 MG TB24 tablet Take 50 mg by mouth daily. 03/26/21   [provider]  naproxen sodium (ALEVE) 220 MG tablet Take 220 mg by mouth.    [provider]  pantoprazole (PROTONIX) 40 MG tablet Take 40 mg by mouth daily. 04/16/21   [provider]      Allergies    Patient has no known allergies.    Review of Systems   Review of Systems  Physical Exam Updated Vital Signs BP (!) 164/66   Pulse 71   Temp 97.7 F (36.5 C)   Resp 18   Wt 93 kg   SpO2 100%   BMI 26.32 kg/m  Physical Exam Vitals and nursing note reviewed.  Constitutional:      Appearance: He is well-developed. He is not diaphoretic.  HENT:     Head: Normocephalic and atraumatic.     Right Ear: Tympanic membrane, ear canal and external ear normal.     Left Ear: Tympanic membrane, ear canal and external ear normal.     Nose: Congestion present. No rhinorrhea.     Mouth/Throat:     Mouth: Mucous membranes are moist. Mucous membranes  are not dry.  Eyes:     General: Lids are normal.     Conjunctiva/sclera:     Right eye: Right conjunctiva is injected.     Left eye: Left conjunctiva is injected.  Neck:     Vascular: No JVD.     Trachea: Trachea normal. No tracheal deviation.  Cardiovascular:     Rate and Rhythm: Normal rate and regular rhythm.     Pulses: No decreased pulses.          Radial pulses are 2+ on the right side and 2+ on the left side.     Heart sounds: S1 normal and S2 normal. Heart sounds not distant. Murmur heard.     Comments: 2/6 systolic murmur right and left sternal borders Pulmonary:     Effort: Pulmonary effort is normal. No respiratory distress.     Breath sounds: Normal breath sounds. No wheezing.  Chest:     Chest wall: No tenderness.  Abdominal:     General: Bowel sounds are normal.     Palpations: Abdomen is soft.     Tenderness: There is no abdominal tenderness. There is no guarding or rebound.  Musculoskeletal:     Cervical back: Normal range of motion and neck supple. No muscular tenderness.  Right lower leg: Edema present.     Left lower leg: Edema present.     Comments: Trace ankle edema  Skin:    General: Skin is warm and dry.     Coloration: Skin is not pale.  Neurological:     Mental Status: He is alert. Mental status is at baseline.  Psychiatric:        Mood and Affect: Mood normal.     ED Results / Procedures / Treatments   Labs (all labs ordered are listed, but only abnormal results are displayed) Labs Reviewed  RESP PANEL BY RT-PCR (RSV, FLU A&B, COVID)  [id number]    EKG None  Radiology DG Chest 2 View  Result Date: 09/22/2022 CLINICAL DATA:  Cough EXAM: CHEST - 2 VIEW COMPARISON:  06/14/2010 FINDINGS: The heart size and mediastinal contours are within normal limits. Both lungs are clear. The visualized skeletal structures are unremarkable. IMPRESSION: No active cardiopulmonary disease. Electronically Signed   By: Duanne Guess D.O.   On: 09/22/2022 10:30     Procedures Procedures    Medications Ordered in ED Medications - No data to display  ED Course/ Medical Decision Making/ A&P    Patient seen and examined. History obtained directly from patient.   Labs/EKG: Ordered flu, COVID, RSV testing.  Imaging: Ordered chest x-ray.  Medications/Fluids: Ordered: None ordered.   Most recent vital signs reviewed and are as follows: BP (!) 164/66   Pulse 71   Temp 97.7 F (36.5 C)   Resp 18   Wt 93 kg   SpO2 100%   BMI 26.32 kg/m   Initial impression: Cough, most consistent with viral bronchitis at this time given constellation of other symptoms.  11:12 AM Reassessment performed. Patient appears stable.  Breathing very comfortably with no hypoxia.  Labs personally reviewed and interpreted including: Viral panel negative  Imaging personally visualized and interpreted including: Chest x-ray agree negative  Reviewed pertinent lab work and imaging with patient at bedside. Questions answered.   Most current vital signs reviewed and are as follows: BP (!) 164/66   Pulse 71   Temp 97.7 F (36.5 C)   Resp 16   Wt 93 kg   SpO2 100%   BMI 26.32 kg/m   Plan: Discharge to home.   Prescriptions written for: Albuterol, Tessalon  Other home care instructions discussed: Can use saline drops and oral antihistamines for eye redness, this will need to otherwise run its course.  ED return instructions discussed: Worsening shortness of breath, trouble breathing, high fevers  Follow-up instructions discussed: Patient encouraged to follow-up with their PCP in 3 days.                             Medical Decision Making Amount and/or Complexity of Data Reviewed Radiology: ordered.   Well-appearing patient with cough and URI symptoms for the past several days.  Chest x-ray negative for pneumonia or edema.  He does have chronic kidney disease but does not appear to be fluid overloaded at this time.  Low concern for new onset heart  failure.  Viral panel negative.  Will treat symptomatically at this time.  The patient's vital signs, pertinent lab work and imaging were reviewed and interpreted as discussed in the ED course. Hospitalization was considered for further testing, treatments, or serial exams/observation. However as patient is well-appearing, has a stable exam, and reassuring studies today, I do not feel that they warrant admission at  this time. This plan was discussed with the patient who verbalizes agreement and comfort with this plan and seems reliable and able to return to the Emergency Department with worsening or changing symptoms.            Final Clinical Impression(s) / ED Diagnoses Final diagnoses:  Viral URI with cough    Rx / DC Orders ED Discharge Orders          Ordered    benzonatate (TESSALON) 100 MG capsule  Every 8 hours        09/22/22 1109              Renne Crigler, PA-C 09/22/22 1115    Rancour, Jeannett Senior, MD 09/22/22 1350

## 2022-09-22 NOTE — ED Triage Notes (Signed)
Productive cough x 34 days , headache , Hx covid last year . Denies shortness of breath , chest congestion .

## 2022-09-22 NOTE — Discharge Instructions (Signed)
Please read and follow all provided instructions.  Your diagnoses today include:  1. Viral URI with cough    You appear to have an upper respiratory infection (URI). An upper respiratory tract infection, or cold, is a viral infection of the air passages leading to the lungs. It should improve gradually after 5-7 days. You may have a lingering cough that lasts for 2- 4 weeks after the infection.  Tests performed today include: Vital signs. See below for your results today.  Chest x-ray: Did not show pneumonia or fluid in the lungs Viral panel: Negative for flu, COVID, RSV  Medications prescribed:  Tessalon Perles - cough suppressant medication  Albuterol inhaler - medication that opens up your airway  Use inhaler as follows: 1-2 puffs with spacer every 4 hours as needed for wheezing, cough, or shortness of breath.   Take any prescribed medications only as directed. Treatment for your infection is aimed at treating the symptoms. There are no medications, such as antibiotics, that will cure your infection.   Home care instructions:  You can take Tylenol and/or Ibuprofen as directed on the packaging for fever reduction and pain relief.    For cough: honey 1/2 to 1 teaspoon (you can dilute the honey in water or another fluid).  You can also use guaifenesin and dextromethorphan for cough. You can use a humidifier for chest congestion and cough.  If you don't have a humidifier, you can sit in the bathroom with the hot shower running.      For sore throat: try warm salt water gargles, cepacol lozenges, throat spray, warm tea or water with lemon/honey, popsicles or ice, or OTC cold relief medicine for throat discomfort.    For congestion: take a daily anti-histamine like Zyrtec, Claritin, and a oral decongestant, such as pseudoephedrine.  You can also use Flonase 1-2 sprays in each nostril daily.    It is important to stay hydrated: drink plenty of fluids (water, gatorade/powerade/pedialyte,  juices, or teas) to keep your throat moisturized and help further relieve irritation/discomfort.   Your illness is contagious and can be spread to others, especially during the first 3 or 4 days. It cannot be cured by antibiotics or other medicines. Take basic precautions such as washing your hands often, covering your mouth when you cough or sneeze, and avoiding public places where you could spread your illness to others.   Please continue drinking plenty of fluids.  Use over-the-counter medicines as needed as directed on packaging for symptom relief.  You may also use ibuprofen or tylenol as directed on packaging for pain or fever.  Do not take multiple medicines containing Tylenol or acetaminophen to avoid taking too much of this medication.  Follow-up instructions: Please follow-up with your primary care provider in the next 3 days for further evaluation of your symptoms if you are not feeling better.   Return instructions:  Please return to the Emergency Department if you experience worsening symptoms.  RETURN IMMEDIATELY IF you develop shortness of breath, confusion or altered mental status, a new rash, become dizzy, faint, or poorly responsive, or are unable to be cared for at home. Please return if you have persistent vomiting and cannot keep down fluids or develop a fever that is not controlled by tylenol or motrin.   Please return if you have any other emergent concerns.  Additional Information:  Your vital signs today were: BP (!) 164/66   Pulse 71   Temp 97.7 F (36.5 C)   Resp 16  Wt 93 kg   SpO2 100%   BMI 26.32 kg/m  If your blood pressure (BP) was elevated above 135/85 this visit, please have this repeated by your doctor within one month. --------------

## 2023-11-03 NOTE — Patient Instructions (Addendum)
 SURGICAL WAITING ROOM VISITATION  Patients having surgery or a procedure may have no more than 2 support people in the waiting area - these visitors may rotate.    Children under the age of 50 must have an adult with them who is not the patient.  Due to an increase in RSV and influenza rates and associated hospitalizations, children ages 73 and under may not visit patients in [facility] hospitals.  Visitors with respiratory illnesses are discouraged from visiting and should remain at home.  If the patient needs to stay at the hospital during part of their recovery, the visitor guidelines for inpatient rooms apply. Pre-op nurse will coordinate an appropriate time for 1 support person to accompany patient in pre-op.  This support person may not rotate.    Please refer to the [facility] website for the visitor guidelines for Inpatients (after your surgery is over and you are in a regular room).       Your procedure is scheduled on: 11/14/23   Report to [facility] Main Entrance    Report to admitting at 6:30 AM   Call this number if you have problems the morning of surgery [phone]   Do not eat food :After Midnight.   After Midnight you may have the following liquids until 6 AM DAY OF SURGERY  Water Non-Citrus Juices (without pulp, NO RED-Apple, White grape, White cranberry) Black Coffee (NO MILK/CREAM OR CREAMERS, sugar ok)  Clear Tea (NO MILK/CREAM OR CREAMERS, sugar ok) regular and decaf                             Plain Jell-O (NO RED)                                           Fruit ices (not with fruit pulp, NO RED)                                     Popsicles (NO RED)                                                               Sports drinks like Gatorade (NO RED)                The day of surgery:  Drink ONE (1) Pre-Surgery Clear Ensure  at 6 AM the morning of surgery. Drink in one sitting. Do not sip.  This drink was given to you during your hospital   pre-op appointment visit. Nothing else to drink after completing the  Pre-Surgery  Ensure   Oral Hygiene is also important to reduce your risk of infection.                                    Remember - BRUSH YOUR TEETH THE MORNING OF SURGERY WITH YOUR REGULAR TOOTHPASTE   Stop all vitamins and herbal supplements 7 days before surgery.   Take these medicines the morning of surgery with A  SIP OF WATER: Amlodipine, linzess, myrbetriq, pantoprazole(protonix), hydrocodone  if needed.               Do not take Losartan the morning of surgery.             You may not have any metal on your body including hair pins, jewelry, and body piercing             Do not wear make-up, lotions, powders, perfumes/cologne, or deodorant               Men may shave face and neck.   Do not bring valuables to the hospital. [facility] IS NOT             RESPONSIBLE   FOR VALUABLES.   Contacts, glasses, dentures or bridgework may not be worn into surgery.  DO NOT BRING YOUR HOME MEDICATIONS TO THE HOSPITAL. PHARMACY WILL DISPENSE MEDICATIONS LISTED ON YOUR MEDICATION LIST TO YOU DURING YOUR ADMISSION IN THE HOSPITAL!    Patients discharged on the day of surgery will not be allowed to drive home.  Someone NEEDS to stay with you for the first 24 hours after anesthesia.   Special Instructions: Bring a copy of your healthcare power of attorney and living will documents the day of surgery if you haven't scanned them before.              Please read over the following fact sheets you were given: IF YOU HAVE QUESTIONS ABOUT YOUR PRE-OP INSTRUCTIONS PLEASE CALL [phone] Garrett Hogan   If you received a COVID test during your pre-op visit  it is requested that you wear a mask when out in public, stay away from anyone that may not be feeling well and notify your surgeon if you develop symptoms. If you test positive for Covid or have been in contact with anyone that has tested positive in the last 10 days please  notify you surgeon.      Pre-operative 5 CHG Bath Instructions   You can play a key role in reducing the risk of infection after surgery. Your skin needs to be as free of germs as possible. You can reduce the number of germs on your skin by washing with CHG (chlorhexidine  gluconate) soap before surgery. CHG is an antiseptic soap that kills germs and continues to kill germs even after washing.   DO NOT use if you have an allergy to chlorhexidine /CHG or antibacterial soaps. If your skin becomes reddened or irritated, stop using the CHG and notify one of our RNs at [phone].   Please shower with the CHG soap starting 4 days before surgery using the following schedule:     Please keep in mind the following:  DO NOT shave, including legs and underarms, starting the day of your first shower.   You may shave your face at any point before/day of surgery.  Place clean sheets on your bed the day you start using CHG soap. Use a clean washcloth (not used since being washed) for each shower. DO NOT sleep with pets once you start using the CHG.   CHG Shower Instructions:  If you choose to wash your hair and private area, wash first with your normal shampoo/soap.  After you use shampoo/soap, rinse your hair and body thoroughly to remove shampoo/soap residue.  Turn the water OFF and apply about 3 tablespoons (45 ml) of CHG soap to a CLEAN washcloth.  Apply CHG soap ONLY FROM YOUR NECK DOWN TO YOUR TOES (  washing for 3-5 minutes)  DO NOT use CHG soap on face, private areas, open wounds, or sores.  Pay special attention to the area where your surgery is being performed.  If you are having back surgery, having someone wash your back for you may be helpful. Wait 2 minutes after CHG soap is applied, then you may rinse off the CHG soap.  Pat dry with a clean towel  Put on clean clothes/pajamas   If you choose to wear lotion, please use ONLY the CHG-compatible lotions on the back of this paper.      Additional instructions for the day of surgery: DO NOT APPLY any lotions, deodorants, cologne, or perfumes.   Put on clean/comfortable clothes.  Brush your teeth.  Ask your nurse before applying any prescription medications to the skin.      CHG Compatible Lotions   Aveeno Moisturizing lotion  Cetaphil Moisturizing Cream  Cetaphil Moisturizing Lotion  Clairol Herbal Essence Moisturizing Lotion, Dry Skin  Clairol Herbal Essence Moisturizing Lotion, Extra Dry Skin  Clairol Herbal Essence Moisturizing Lotion, Normal Skin  Curel Age Defying Therapeutic Moisturizing Lotion with Alpha Hydroxy  Curel Extreme Care Body Lotion  Curel Soothing Hands Moisturizing Hand Lotion  Curel Therapeutic Moisturizing Cream, Fragrance-Free  Curel Therapeutic Moisturizing Lotion, Fragrance-Free  Curel Therapeutic Moisturizing Lotion, Original Formula  Eucerin Daily Replenishing Lotion  Eucerin Dry Skin Therapy Plus Alpha Hydroxy Crme  Eucerin Dry Skin Therapy Plus Alpha Hydroxy Lotion  Eucerin Original Crme  Eucerin Original Lotion  Eucerin Plus Crme Eucerin Plus Lotion  Eucerin TriLipid Replenishing Lotion  Keri Anti-Bacterial Hand Lotion  Keri Deep Conditioning Original Lotion Dry Skin Formula Softly Scented  Keri Deep Conditioning Original Lotion, Fragrance Free Sensitive Skin Formula  Keri Lotion Fast Absorbing Fragrance Free Sensitive Skin Formula  Keri Lotion Fast Absorbing Softly Scented Dry Skin Formula  Keri Original Lotion  Keri Skin Renewal Lotion Keri Silky Smooth Lotion  Keri Silky Smooth Sensitive Skin Lotion  Nivea Body Creamy Conditioning Oil  Nivea Body Extra Enriched Lotion  Nivea Body Original Lotion  Nivea Body Sheer Moisturizing Lotion Nivea Crme  Nivea Skin Firming Lotion  NutraDerm 30 Skin Lotion  NutraDerm Skin Lotion  NutraDerm Therapeutic Skin Cream  NutraDerm Therapeutic Skin Lotion  ProShield Protective Hand Cream  [place]- Preparing for Total Shoulder  Arthroplasty    Before surgery, you can play an important role. Because skin is not sterile, your skin needs to be as free of germs as possible. You can reduce the number of germs on your skin by using the following products. Benzoyl Peroxide Gel Reduces the number of germs present on the skin Applied twice a day to shoulder area starting two days before surgery    ==================================================================  Please follow these instructions carefully:  BENZOYL PEROXIDE 5% GEL  Please do not use if you have an allergy to benzoyl peroxide.   If your skin becomes reddened/irritated stop using the benzoyl peroxide.  Starting two days before surgery, apply as follows: Apply benzoyl peroxide in the morning and at night. Apply after taking a shower. If you are not taking a shower clean entire shoulder front, back, and side along with the armpit with a clean wet washcloth.  Place a quarter-sized dollop on your shoulder and rub in thoroughly, making sure to cover the front, back, and side of your shoulder, along with the armpit.   2 days before ____ AM   ____ PM  1 day before ____ AM   ____ PM                         Do this twice a day for two days.  (Last application is the night before surgery, AFTER using the CHG soap as described below).  Do NOT apply benzoyl peroxide gel on the day of surgery.

## 2023-11-03 NOTE — Progress Notes (Addendum)
 COVID Vaccine received:  []  No [x]  Yes Date of any COVID positive Test in last 90 days: no PCP - Benn Brash PA-C Cardiologist - Dr.  Lennie Ra  Chest x-ray -  EKG -   Stress Test -  ECHO - 03/19/21 Epic Cardiac Cath -   Bowel Prep - [x]  No  []   Yes ______  Pacemaker / ICD device [x]  No []  Yes   Spinal Cord Stimulator:[x]  No []  Yes       History of Sleep Apnea? [x]  No []  Yes   CPAP used?- [x]  No []  Yes    Does the patient monitor blood sugar?          [x]  No []  Yes  []  N/A  Patient has: [x]  NO Hx DM   []  Pre-DM                 []  DM1  []   DM2 Does patient have a [employer] or Dexacom? []  No []  Yes   Fasting Blood Sugar Ranges-  Checks Blood Sugar _____ times a day  GLP1 agonist / usual dose - no GLP1 instructions:  SGLT-2 inhibitors / usual dose - no SGLT-2 instructions:   Blood Thinner / Instructions:no Aspirin  Instructions:no  Comments:   Activity level: Patient is able  to climb a flight of stairs without difficulty; [x]  No CP  [x]  No SOB,   Patient can  perform ADLs without assistance.   Anesthesia review: CAD,CKD, HTN, murmur, palpitations  Patient denies shortness of breath, fever, cough and chest pain at PAT appointment.  Patient verbalized understanding and agreement to the Pre-Surgical Instructions that were given to them at this PAT appointment. Patient was also educated of the need to review these PAT instructions again prior to his/her surgery.I reviewed the appropriate phone numbers to call if they have any and questions or concerns.

## 2023-11-04 ENCOUNTER — Encounter (HOSPITAL_COMMUNITY): Payer: Self-pay

## 2023-11-04 ENCOUNTER — Encounter (HOSPITAL_COMMUNITY)
Admission: RE | Admit: 2023-11-04 | Discharge: 2023-11-04 | Disposition: A | Discharge status: Home / Self Care | Source: Ambulatory Visit | Attending: Orthopedic Surgery | Admitting: Orthopedic Surgery

## 2023-11-04 ENCOUNTER — Other Ambulatory Visit: Payer: Self-pay

## 2023-11-04 VITALS — BP 155/75 | HR 75 | Temp 98.2°F | Resp 16 | Ht 75.0 in | Wt 200.0 lb

## 2023-11-04 DIAGNOSIS — M129 Arthropathy, unspecified: Secondary | ICD-10-CM | POA: Diagnosis not present

## 2023-11-04 DIAGNOSIS — I10 Essential (primary) hypertension: Secondary | ICD-10-CM | POA: Insufficient documentation

## 2023-11-04 DIAGNOSIS — Z01818 Encounter for other preprocedural examination: Secondary | ICD-10-CM | POA: Insufficient documentation

## 2023-11-04 DIAGNOSIS — Z87891 Personal history of nicotine dependence: Secondary | ICD-10-CM | POA: Diagnosis not present

## 2023-11-04 LAB — CBC
HCT: 43.4 % (ref 39.0–52.0)
Hemoglobin: 13.4 g/dL (ref 13.0–17.0)
MCH: 31.8 pg (ref 26.0–34.0)
MCHC: 30.9 g/dL (ref 30.0–36.0)
MCV: 102.8 fL — ABNORMAL HIGH (ref 80.0–100.0)
Platelets: 186 10*3/uL (ref 150–400)
RBC: 4.22 MIL/uL (ref 4.22–5.81)
RDW: 13 % (ref 11.5–15.5)
WBC: 4.1 10*3/uL (ref 4.0–10.5)
nRBC: 0 % (ref 0.0–0.2)

## 2023-11-04 LAB — BASIC METABOLIC PANEL WITH GFR
Anion gap: 9 (ref 5–15)
BUN: 19 mg/dL (ref 8–23)
CO2: 25 mmol/L (ref 22–32)
Calcium: 9.2 mg/dL (ref 8.9–10.3)
Chloride: 106 mmol/L (ref 98–111)
Creatinine, Ser: 1.24 mg/dL (ref 0.61–1.24)
GFR, Estimated: 60 mL/min — ABNORMAL LOW (ref >60)
Glucose, Bld: 102 mg/dL — ABNORMAL HIGH (ref 70–99)
Potassium: 4.2 mmol/L (ref 3.5–5.1)
Sodium: 140 mmol/L (ref 135–145)

## 2023-11-04 LAB — SURGICAL PCR SCREEN
MRSA, PCR: NEGATIVE
Staphylococcus aureus: NEGATIVE

## 2023-11-05 NOTE — Anesthesia Preprocedure Evaluation (Addendum)
 Anesthesia Evaluation  Patient identified by MRN, date of birth, ID band Patient awake    Reviewed: Allergy & Precautions, NPO status , Patient's Chart, lab work & pertinent test results, reviewed documented beta blocker date and time   Airway Mallampati: II       Dental no notable dental hx. (+) Dental Advisory Given   Pulmonary former smoker   Pulmonary exam normal breath sounds clear to auscultation       Cardiovascular hypertension, Pt. on medications + CAD  Normal cardiovascular exam+ Valvular Problems/Murmurs AS  Rhythm:Regular Rate:Normal  EKG 11/04/23 Normal sinus rhythm Minimal voltage criteria for LVH, may be normal variant ( Sokolow-Lyon )  Echo 05/13/2023 1. The left ventricle size is normal  2. There is mild concentric left ventricular hypertrophy 3. Overall left ventricular systolic function is normal with an EF between 60-65% 4. The left atrium is moderately dilated 5. The right ventricle is normal in size and function 6. The right atrium is normal in size and function  7. Aortic valve is trileaflet and is moderately thickened 8. Mild to moderate aortic stenosis with peak/mean pressure gradient 39.75 mmHg/24.56 mmHg, the aortic valve area by continuity equation is 1.6 cm 2.  9. Normal appearing mitral valve with normal valve function  10. The tricuspid valve appears structurally normal  11. Mild to moderate tricuspid regurgitation present 12. The right ventricular systolic pressure as measured by Doppler is 44.44 mmHg 13. There is no pericardial effusion  14. The aortic root, ascending aorta and aortic arch are normal.        Neuro/Psych  Headaches  negative psych ROS   GI/Hepatic ,GERD  Medicated,,(+) Hepatitis -, C  Endo/Other  HLD  Renal/GU Renal InsufficiencyRenal disease   BPH    Musculoskeletal  (+) Arthritis , Osteoarthritis,  OA left shoulder   Abdominal   Peds  Hematology negative  hematology ROS (+)   Anesthesia Other Findings   Reproductive/Obstetrics                              Anesthesia Physical Anesthesia Plan  ASA: 3  Anesthesia Plan: General   Post-op Pain Management: Regional block*, Minimal or no pain anticipated and Precedex   Induction: Intravenous  PONV Risk Score and Plan: 3 and Treatment may vary due to age or medical condition, Ondansetron  and Dexamethasone   Airway Management Planned: Oral ETT  Additional Equipment: None  Intra-op Plan:   Post-operative Plan: Extubation in OR  Informed Consent: I have reviewed the patients History and Physical, chart, labs and discussed the procedure including the risks, benefits and alternatives for the proposed anesthesia with the patient or authorized representative who has indicated his/her understanding and acceptance.     Dental advisory given  Plan Discussed with: CRNA and Anesthesiologist  Anesthesia Plan Comments: (See PAT note 11/04/2023)        Anesthesia Quick Evaluation

## 2023-11-05 NOTE — Progress Notes (Addendum)
 Anesthesia Chart Review   Case: [id number] Date/Time: 11/14/23 0845   Procedure: ARTHROPLASTY, SHOULDER, TOTAL, REVERSE (Left: Shoulder)   Anesthesia type: Choice   Pre-op diagnosis: Left shoulder rotator cuff arthropathy   Location: [facility] ROOM 06 / [facility]   Surgeons: Janeth Medicus, MD       DISCUSSION:78 y.o. former smoker with h/o HTN, mild to moderate AS (Echo 05/13/2023 with mean gradient 24.56 mmHg, AVA 1.6 cm2, under media tab and outlined below), CKD Stage III, left shoulder rotator cuff arthropathy scheduled for above procedure 11/14/2023 with Dr. Carter Clare.   Clearance received from PCP which states pt is cleared for upcoming surgery.   Pt reports he can climb a flight of stairs with shortness of breath or chest pain. Clearance received from cardiology which states pt is moderate risk, h/o mild to moderate AS.   VS: BP (!) 155/75   Pulse 75   Temp 36.8 C (Oral)   Resp 16   Ht 6\' 3"  (1.905 m)   Wt 90.7 kg   SpO2 100%   BMI 25.00 kg/m   PROVIDERS: Benn Brash, PA-C is PCP   Sullivan Endow, MD is Cardiologist  LABS: Labs reviewed: Acceptable for surgery. (all labs ordered are listed, but only abnormal results are displayed)  Labs Reviewed  BASIC METABOLIC PANEL WITH GFR - Abnormal; Notable for the following components:      Result Value   Glucose, Bld 102 (*)    GFR, Estimated 60 (*)    All other components within normal limits  CBC - Abnormal; Notable for the following components:   MCV 102.8 (*)    All other components within normal limits  SURGICAL PCR SCREEN     IMAGES:   EKG:   CV: Echo 05/13/2023 The left ventricle size is normal  There is mild concentric left ventricular hypertrophy Overall left ventricular systolic function is normal with an EF between 60-65% The left atrium is moderately dilated The right ventricle is normal in size and function The right atrium is normal in size and function  Aortic valve is trileaflet and  is moderately thickened Mild to moderate aortic stenosis with peak/mean pressure gradient 39.75 mmHg/24.56 mmHg, the aortic valve area by continuity equation is 1.6 cm 2.  Normal appearing mitral valve with normal valve function  The tricuspid valve appears structurally normal  Mild to moderate tricuspid regurgitation present The right ventricular systolic pressure as measured by Doppler is 44.44 mmHg There is no pericardial effusion  The aortic root, ascending aorta and aortic arch are normal.   Echo 03/19/2021 The left ventricle size is normal  There is mild concentric left ventricular hypertrophy Overall left ventricular systolic function is normal with, an EF between 60-65% The left atrium is moderately dilated The right ventricle is normal in size and function  The right atrium is normal in size and function  Aortic valve is trileaflet and is mild to moderately thickened Trace amount of aortic regurgitation Mild to moderate aortic stenosis with peak/mean pressure gradient of 31.7 mmHg/19.79 mmHg, the aortic valve area by continuity equation is 1.5 cm2.  Normal appearing mitral valve with normal valve function  There is trace mitral regurgitation There is no pericardial effusion The aortic root, ascending aorta and aortic arch are normal.  Past Medical History:  Diagnosis Date   Arthritis    BPH (benign prostatic hyperplasia)    CAD (coronary artery disease)    CKD (chronic kidney disease), stage III (HCC)  Coronary artery stenosis    GERD (gastroesophageal reflux disease)    Headache    Hepatitis    hx of Hep C - 1970s    Hypercholesteremia    Hypertension    LVH (left ventricular hypertrophy)    Murmur, cardiac     Past Surgical History:  Procedure Laterality Date   CARDIOVASCULAR STRESS TEST  2014   PARTIAL KNEE ARTHROPLASTY Right 10/10/2014   Procedure: RIGHT UNI-KNEE ARTHROPLASTY MEDIALLY;  Surgeon: Claiborne Crew, MD;  Location: [facility];  Service: Orthopedics;   Laterality: Right;   PARTIAL KNEE ARTHROPLASTY Left 07/23/2016   Procedure: LEFT MEDIALLY UNICOMPARTMENTAL KNEE;  Surgeon: Claiborne Crew, MD;  Location: [facility];  Service: Orthopedics;  Laterality: Left;   TONSILLECTOMY     VARICOSE VEIN SURGERY     left leg     MEDICATIONS:  amLODipine (NORVASC) 10 MG tablet   HYDROcodone -acetaminophen  (NORCO/VICODIN) 5-325 MG tablet   linaclotide (LINZESS) 72 MCG capsule   losartan (COZAAR) 25 MG tablet   MYRBETRIQ 50 MG TB24 tablet   pantoprazole (PROTONIX) 40 MG tablet   polyethylene glycol (MIRALAX  / GLYCOLAX ) 17 g packet   traMADol (ULTRAM) 50 MG tablet   No current facility-administered medications for this encounter.     Chick Cotton Ward, PA-C [facility] [phone]

## 2023-11-14 ENCOUNTER — Encounter (HOSPITAL_COMMUNITY)
Admission: RE | Disposition: A | Discharge status: Home / Self Care | Payer: Self-pay | Source: Home / Self Care | Attending: Orthopedic Surgery

## 2023-11-14 ENCOUNTER — Ambulatory Visit (HOSPITAL_COMMUNITY): Payer: Self-pay | Admitting: Physician Assistant

## 2023-11-14 ENCOUNTER — Other Ambulatory Visit: Payer: Self-pay

## 2023-11-14 ENCOUNTER — Ambulatory Visit (HOSPITAL_BASED_OUTPATIENT_CLINIC_OR_DEPARTMENT_OTHER): Payer: Self-pay | Admitting: Anesthesiology

## 2023-11-14 ENCOUNTER — Encounter (HOSPITAL_COMMUNITY): Payer: Self-pay | Admitting: Orthopedic Surgery

## 2023-11-14 ENCOUNTER — Ambulatory Visit (HOSPITAL_COMMUNITY)
Admission: RE | Admit: 2023-11-14 | Discharge: 2023-11-14 | Disposition: A | Discharge status: Home / Self Care | Attending: Orthopedic Surgery | Admitting: Orthopedic Surgery

## 2023-11-14 ENCOUNTER — Ambulatory Visit (HOSPITAL_COMMUNITY)

## 2023-11-14 DIAGNOSIS — I129 Hypertensive chronic kidney disease with stage 1 through stage 4 chronic kidney disease, or unspecified chronic kidney disease: Secondary | ICD-10-CM | POA: Insufficient documentation

## 2023-11-14 DIAGNOSIS — N400 Enlarged prostate without lower urinary tract symptoms: Secondary | ICD-10-CM | POA: Diagnosis not present

## 2023-11-14 DIAGNOSIS — K219 Gastro-esophageal reflux disease without esophagitis: Secondary | ICD-10-CM | POA: Diagnosis not present

## 2023-11-14 DIAGNOSIS — Z87891 Personal history of nicotine dependence: Secondary | ICD-10-CM | POA: Diagnosis not present

## 2023-11-14 DIAGNOSIS — Z8619 Personal history of other infectious and parasitic diseases: Secondary | ICD-10-CM | POA: Diagnosis not present

## 2023-11-14 DIAGNOSIS — M19012 Primary osteoarthritis, left shoulder: Secondary | ICD-10-CM | POA: Diagnosis present

## 2023-11-14 DIAGNOSIS — N183 Chronic kidney disease, stage 3 unspecified: Secondary | ICD-10-CM | POA: Diagnosis not present

## 2023-11-14 DIAGNOSIS — R011 Cardiac murmur, unspecified: Secondary | ICD-10-CM | POA: Diagnosis not present

## 2023-11-14 DIAGNOSIS — I350 Nonrheumatic aortic (valve) stenosis: Secondary | ICD-10-CM | POA: Insufficient documentation

## 2023-11-14 DIAGNOSIS — I071 Rheumatic tricuspid insufficiency: Secondary | ICD-10-CM | POA: Insufficient documentation

## 2023-11-14 DIAGNOSIS — I2510 Atherosclerotic heart disease of native coronary artery without angina pectoris: Secondary | ICD-10-CM

## 2023-11-14 HISTORY — PX: REVERSE SHOULDER ARTHROPLASTY: SHX5054

## 2023-11-14 SURGERY — ARTHROPLASTY, SHOULDER, TOTAL, REVERSE
Anesthesia: General | Site: Shoulder | Laterality: Left

## 2023-11-14 MED ORDER — VANCOMYCIN HCL 1000 MG IV SOLR
INTRAVENOUS | Status: DC | PRN
  Administered 2023-11-14: 1000 mg via TOPICAL

## 2023-11-14 MED ORDER — LACTATED RINGERS IV SOLN: INTRAVENOUS | Status: DC

## 2023-11-14 MED ORDER — PHENYLEPHRINE HCL (PRESSORS) 10 MG/ML IV SOLN
INTRAVENOUS | Status: AC
  Filled 2023-11-14: qty 1

## 2023-11-14 MED ORDER — FENTANYL CITRATE (PF) 100 MCG/2ML IJ SOLN
INTRAMUSCULAR | Status: DC | PRN
  Administered 2023-11-14: 100 ug via INTRAVENOUS

## 2023-11-14 MED ORDER — DEXMEDETOMIDINE HCL IN NACL 80 MCG/20ML IV SOLN
INTRAVENOUS | Status: DC | PRN
  Administered 2023-11-14 (×3): 4 ug via INTRAVENOUS

## 2023-11-14 MED ORDER — BUPIVACAINE HCL (PF) 0.5 % IJ SOLN
INTRAMUSCULAR | Status: DC | PRN
  Administered 2023-11-14: 15 mL via PERINEURAL

## 2023-11-14 MED ORDER — LACTATED RINGERS IV SOLN: INTRAVENOUS | Status: DC | PRN

## 2023-11-14 MED ORDER — LIDOCAINE HCL (PF) 2 % IJ SOLN
INTRAMUSCULAR | Status: DC | PRN
  Administered 2023-11-14: 100 mg via INTRADERMAL

## 2023-11-14 MED ORDER — PHENYLEPHRINE 80 MCG/ML (10ML) SYRINGE FOR IV PUSH (FOR BLOOD PRESSURE SUPPORT)
PREFILLED_SYRINGE | INTRAVENOUS | Status: AC
Start: 2023-11-14 — End: 2023-11-14
  Filled 2023-11-14: qty 10

## 2023-11-14 MED ORDER — CEFAZOLIN SODIUM-DEXTROSE 2-4 GM/100ML-% IV SOLN
2.0000 g | INTRAVENOUS | Status: AC
  Administered 2023-11-14: 2 g via INTRAVENOUS
  Filled 2023-11-14: qty 100

## 2023-11-14 MED ORDER — FENTANYL CITRATE PF 50 MCG/ML IJ SOSY
50.0000 ug | PREFILLED_SYRINGE | INTRAMUSCULAR | Status: DC
  Administered 2023-11-14: 50 ug via INTRAVENOUS
  Filled 2023-11-14: qty 2

## 2023-11-14 MED ORDER — FENTANYL CITRATE (PF) 100 MCG/2ML IJ SOLN
INTRAMUSCULAR | Status: AC
Start: 2023-11-14 — End: 2023-11-14
  Filled 2023-11-14: qty 2

## 2023-11-14 MED ORDER — OXYCODONE HCL 5 MG PO TABS: 5.0000 mg | ORAL_TABLET | Freq: Once | ORAL | Status: DC | PRN

## 2023-11-14 MED ORDER — PHENYLEPHRINE HCL (PRESSORS) 10 MG/ML IV SOLN
INTRAVENOUS | Status: AC
Start: 2023-11-14 — End: 2023-11-14
  Filled 2023-11-14: qty 1

## 2023-11-14 MED ORDER — PHENYLEPHRINE HCL-NACL 20-0.9 MG/250ML-% IV SOLN
INTRAVENOUS | Status: DC | PRN
  Administered 2023-11-14: 50 ug/min via INTRAVENOUS

## 2023-11-14 MED ORDER — TRANEXAMIC ACID-NACL 1000-0.7 MG/100ML-% IV SOLN
INTRAVENOUS | Status: DC | PRN
  Administered 2023-11-14: 1000 mg via INTRAVENOUS

## 2023-11-14 MED ORDER — TRANEXAMIC ACID-NACL 1000-0.7 MG/100ML-% IV SOLN
1000.0000 mg | INTRAVENOUS | Status: DC
  Filled 2023-11-14: qty 100

## 2023-11-14 MED ORDER — DEXAMETHASONE SODIUM PHOSPHATE 10 MG/ML IJ SOLN
INTRAMUSCULAR | Status: AC
  Filled 2023-11-14: qty 1

## 2023-11-14 MED ORDER — OXYCODONE HCL 5 MG PO TABS: 5.0000 mg | ORAL_TABLET | ORAL | 0 refills | Status: DC | PRN

## 2023-11-14 MED ORDER — BUPIVACAINE LIPOSOME 1.3 % IJ SUSP
INTRAMUSCULAR | Status: DC | PRN
  Administered 2023-11-14: 10 mL via PERINEURAL

## 2023-11-14 MED ORDER — LIDOCAINE HCL (PF) 2 % IJ SOLN
INTRAMUSCULAR | Status: AC
Start: 2023-11-14 — End: 2023-11-14
  Filled 2023-11-14: qty 5

## 2023-11-14 MED ORDER — OXYCODONE HCL 5 MG/5ML PO SOLN: 5.0000 mg | Freq: Once | ORAL | Status: DC | PRN

## 2023-11-14 MED ORDER — DEXAMETHASONE SODIUM PHOSPHATE 10 MG/ML IJ SOLN
INTRAMUSCULAR | Status: DC | PRN
  Administered 2023-11-14: 8 mg via INTRAVENOUS

## 2023-11-14 MED ORDER — SUGAMMADEX SODIUM 200 MG/2ML IV SOLN
INTRAVENOUS | Status: DC | PRN
  Administered 2023-11-14: 200 mg via INTRAVENOUS

## 2023-11-14 MED ORDER — 0.9 % SODIUM CHLORIDE (POUR BTL) OPTIME
TOPICAL | Status: DC | PRN
  Administered 2023-11-14: 1000 mL

## 2023-11-14 MED ORDER — ONDANSETRON 4 MG PO TBDP: 4.0000 mg | ORAL_TABLET | Freq: Three times a day (TID) | ORAL | 0 refills | Status: DC | PRN

## 2023-11-14 MED ORDER — PHENYLEPHRINE 80 MCG/ML (10ML) SYRINGE FOR IV PUSH (FOR BLOOD PRESSURE SUPPORT)
PREFILLED_SYRINGE | INTRAVENOUS | Status: DC | PRN
  Administered 2023-11-14: 80 ug via INTRAVENOUS

## 2023-11-14 MED ORDER — ONDANSETRON HCL 4 MG/2ML IJ SOLN: 4.0000 mg | Freq: Once | INTRAMUSCULAR | Status: DC | PRN

## 2023-11-14 MED ORDER — DEXMEDETOMIDINE HCL IN NACL 80 MCG/20ML IV SOLN
INTRAVENOUS | Status: AC
  Filled 2023-11-14: qty 20

## 2023-11-14 MED ORDER — ORAL CARE MOUTH RINSE: 15.0000 mL | Freq: Once | OROMUCOSAL | Status: AC

## 2023-11-14 MED ORDER — ROCURONIUM BROMIDE 10 MG/ML (PF) SYRINGE
PREFILLED_SYRINGE | INTRAVENOUS | Status: DC | PRN
  Administered 2023-11-14: 80 mg via INTRAVENOUS

## 2023-11-14 MED ORDER — CHLORHEXIDINE GLUCONATE 0.12 % MT SOLN
15.0000 mL | Freq: Once | OROMUCOSAL | Status: AC
  Administered 2023-11-14: 15 mL via OROMUCOSAL

## 2023-11-14 MED ORDER — PROPOFOL 10 MG/ML IV BOLUS
INTRAVENOUS | Status: AC
  Filled 2023-11-14: qty 20

## 2023-11-14 MED ORDER — ONDANSETRON HCL 4 MG/2ML IJ SOLN
INTRAMUSCULAR | Status: AC
  Filled 2023-11-14: qty 2

## 2023-11-14 MED ORDER — ROCURONIUM BROMIDE 10 MG/ML (PF) SYRINGE
PREFILLED_SYRINGE | INTRAVENOUS | Status: AC
  Filled 2023-11-14: qty 10

## 2023-11-14 MED ORDER — ONDANSETRON HCL 4 MG/2ML IJ SOLN
INTRAMUSCULAR | Status: DC | PRN
  Administered 2023-11-14: 4 mg via INTRAVENOUS

## 2023-11-14 MED ORDER — ONDANSETRON 4 MG PO TBDP: 4.0000 mg | ORAL_TABLET | Freq: Three times a day (TID) | ORAL | 0 refills | Status: AC | PRN

## 2023-11-14 MED ORDER — PROPOFOL 10 MG/ML IV BOLUS
INTRAVENOUS | Status: DC | PRN
  Administered 2023-11-14: 160 mg via INTRAVENOUS

## 2023-11-14 MED ORDER — OXYCODONE HCL 5 MG PO TABS: 5.0000 mg | ORAL_TABLET | ORAL | 0 refills | Status: AC | PRN

## 2023-11-14 MED ORDER — MIDAZOLAM HCL 2 MG/2ML IJ SOLN: 1.0000 mg | INTRAMUSCULAR | Status: DC

## 2023-11-14 MED ORDER — FENTANYL CITRATE PF 50 MCG/ML IJ SOSY: 25.0000 ug | PREFILLED_SYRINGE | INTRAMUSCULAR | Status: DC | PRN

## 2023-11-14 SURGICAL SUPPLY — 58 items
AUGMENT COMP REV MI TAPR ADPTR (Joint) IMPLANT
BAG COUNTER SPONGE SURGICOUNT (BAG) IMPLANT
BAG ZIPLOCK 12X15 (MISCELLANEOUS) ×1 IMPLANT
BIT DRILL F/CENTRAL SCRW 3.2MM (BIT) IMPLANT
BIT DRILL QUICK REL 1/8 2PK SL (BIT) IMPLANT
BIT DRILL TWIST 2.7 (BIT) IMPLANT
BLADE SAG 18X100X1.27 (BLADE) ×1 IMPLANT
COMP HUM UNI IDENTI -6 EXT (Joint) IMPLANT
COMP HUM UNI IDENTI 36 +0 (Joint) IMPLANT
COOLER ICEMAN CLASSIC (MISCELLANEOUS) ×1 IMPLANT
COVER BACK TABLE 60X90IN (DRAPES) ×1 IMPLANT
COVER SURGICAL LIGHT HANDLE (MISCELLANEOUS) ×1 IMPLANT
DRAPE POUCH INSTRU U-SHP 10X18 (DRAPES) ×1 IMPLANT
DRAPE SHEET LG 3/4 BI-LAMINATE (DRAPES) ×1 IMPLANT
DRAPE SURG 17X11 SM STRL (DRAPES) ×1 IMPLANT
DRAPE SURG ORHT 6 SPLT 77X108 (DRAPES) ×2 IMPLANT
DRAPE TOP 10253 STERILE (DRAPES) ×1 IMPLANT
DRAPE U-SHAPE 47X51 STRL (DRAPES) ×1 IMPLANT
DRSG AQUACEL AG ADV 3.5X 6 (GAUZE/BANDAGES/DRESSINGS) IMPLANT
DRSG AQUACEL AG ADV 3.5X10 (GAUZE/BANDAGES/DRESSINGS) IMPLANT
DURAPREP 26ML APPLICATOR (WOUND CARE) ×1 IMPLANT
ELECT PENCIL ROCKER SW 15FT (MISCELLANEOUS) ×1 IMPLANT
ELECT REM PT RETURN 15FT ADLT (MISCELLANEOUS) ×1 IMPLANT
FACESHIELD WRAPAROUND (MASK) ×1 IMPLANT
FACESHIELD WRAPAROUND OR TEAM (MASK) ×1 IMPLANT
GLENOID SPHERE 36+6 (Joint) IMPLANT
GLOVE BIO SURGEON STRL SZ7.5 (GLOVE) ×4 IMPLANT
GLOVE BIOGEL PI IND STRL 8 (GLOVE) ×2 IMPLANT
GOWN STRL REUS W/ TWL XL LVL3 (GOWN DISPOSABLE) ×2 IMPLANT
GUIDE RSA SHLD BM ROT L SU (ORTHOPEDIC DISPOSABLE SUPPLIES) IMPLANT
KIT BASIN OR (CUSTOM PROCEDURE TRAY) ×1 IMPLANT
KIT TURNOVER KIT A (KITS) ×2 IMPLANT
MANIFOLD NEPTUNE II (INSTRUMENTS) ×1 IMPLANT
NDL TAPERED W/ NITINOL LOOP (MISCELLANEOUS) IMPLANT
NEEDLE TAPERED W/ NITINOL LOOP (MISCELLANEOUS) ×1 IMPLANT
NS IRRIG 1000ML POUR BTL (IV SOLUTION) ×1 IMPLANT
PACK SHOULDER (CUSTOM PROCEDURE TRAY) ×1 IMPLANT
PAD COLD SHLDR WRAP-ON (PAD) ×1 IMPLANT
PIN THREADED REVERSE (PIN) IMPLANT
RESTRAINT HEAD UNIVERSAL NS (MISCELLANEOUS) ×1 IMPLANT
SCREW BONE CORT 6.5X35MM (Screw) IMPLANT
SCREW LOCKING 4.75MMX15MM (Screw) IMPLANT
SCREW LOCKING NS 4.75MMX20MM (Screw) IMPLANT
SCREW LOCKING STRL 4.75X25X3.5 (Screw) IMPLANT
SLING ARM IMMOBILIZER LRG (SOFTGOODS) IMPLANT
SLING ARM IMMOBILIZER MED (SOFTGOODS) ×1 IMPLANT
STEM HUM STD SHLD 9 (Stem) IMPLANT
STRAP UNIV POSITION HEAD NS (MISCELLANEOUS) IMPLANT
STRIP CLOSURE SKIN 1/2X4 (GAUZE/BANDAGES/DRESSINGS) ×1 IMPLANT
SUT MNCRL AB 3-0 PS2 18 (SUTURE) ×1 IMPLANT
SUT MON AB 2-0 CT1 36 (SUTURE) ×1 IMPLANT
SUT VIC AB 0 CT1 36 (SUTURE) ×1 IMPLANT
SUT VIC AB 1 CT1 36 (SUTURE) ×1 IMPLANT
SUTURE FIBERWR #2 38 T-5 BLUE (SUTURE) IMPLANT
SUTURE TAPE 1.3 40 TPR END (SUTURE) ×1 IMPLANT
TOWEL OR 17X26 10 PK STRL BLUE (TOWEL DISPOSABLE) ×1 IMPLANT
TOWER SMARTMIX MINI (MISCELLANEOUS) IMPLANT
TUBE SUCTION HIGH CAP CLEAR NV (SUCTIONS) ×1 IMPLANT

## 2023-11-14 NOTE — Op Note (Signed)
 11/14/2023  10:12 AM  PATIENT:  Garrett Hogan    PRE-OPERATIVE DIAGNOSIS:  Left shoulder rotator cuff arthropathy  POST-OPERATIVE DIAGNOSIS:  Same  PROCEDURE:  Left ARTHROPLASTY, SHOULDER, TOTAL, REVERSE  SURGEON:  Janeth Medicus, MD  ASSISTANT: Karyl Paget, PA-C  Assistant attestation:  PA Mcclung present and scrubbed for the entire procedure.  ANESTHESIA:   General With interscalene with Exparel   ESTIMATED BLOOD LOSS: 75 cc  PREOPERATIVE INDICATIONS:  Garrett Hogan is a  78 y.o. male with a diagnosis of Left shoulder rotator cuff arthropathy who failed conservative measures and elected for surgical management.    The risks benefits and alternatives were discussed with the patient preoperatively including but not limited to the risks of infection, bleeding, nerve injury, cardiopulmonary complications, the need for revision surgery, dislocation, brachial plexus palsy, incomplete relief of pain, among others, and the patient was willing to proceed.  OPERATIVE IMPLANTS:  Zimmer identity size 9 stem Flat 135 degree polyethylene liner with extra extended tray Small augmented baseplate 24 mm with 35 mm compression screw centrally and 4 peripheral locking screws and a 36+6 mm lateralized glenosphere   OPERATIVE FINDINGS: High riding humeral head with advanced rotator cuff arthropathy changes and superior wear pattern in the glenoid and deficiency of the supraspinatus.  OPERATIVE PROCEDURE: The patient was brought to the operating room and placed in the supine position. General anesthesia was administered. IV antibiotics were given. A Foley was not placed. Time out was performed. The upper extremity was prepped and draped in usual sterile fashion. The patient was in a beachchair position. Deltopectoral approach was carried out. The biceps was tenodesed to the pectoralis tendon with #2 Fiberwire. The subscapularis was released off of the bone.   I then performed  circumferential releases of the humerus, and then dislocated the head, and then reamed with the reamer to the above named size.  I then applied the jig, and cut the humeral head in 30 of retroversion, and then turned my attention to the glenoid.  Deep retractors were placed, and I resected the labrum, and then placed a guidepin into the center position on the glenoid, with slight inferior inclination. I then reamed over the guidepin, and this created a small metaphyseal cancellus blush inferiorly, removing just the cartilage to the subchondral bone superiorly.  We measured that the small augment fit appropriately.  We did use the 3D printed preoperative guides for accuracy of depth of ream.  The base plate was selected and impacted place, and then I secured it centrally with a nonlocking screw, and I had excellent purchase both inferiorly and superiorly. I placed a short locking screws on anterior and posterior aspects.  I then turned my attention to the glenosphere, and impacted this into place, placing slight inferior offset (set on A).   The glenoid sphere was completely seated, and had engagement of the [facility] taper. I then turned my attention back to the humerus.  I sequentially broached, and then trialed, and was found to restore soft tissue tension, and it had 2 finger tightness. Therefore the above named components were selected. The shoulder felt stable throughout functional motion.   I then impacted the real prosthesis into place, as well as the real humeral tray, and reduced the shoulder. The shoulder had excellent motion, and was stable, and I irrigated the wounds copiously.   Subscapularis was not repaired.  I then irrigated the shoulder copiously once more, placed 1 g of vancomycin powder into the wound, repaired  the deltopectoral interval with #2 FiberWire followed by subcutaneous Vicryl, then monocryl for the skin,  with Steri-Strips and sterile gauze for the skin. The patient was  awakened and returned back in stable and satisfactory condition. There no complications and they tolerated the procedure well.  All counts were correct x2.  Disposition:  Garrett Hogan will be discharged today from PACU.  In his sling at all times for the first 2 weeks.  He can shower in 2 days.  He will begin using the arm for activities of daily living immediately.  I will see him in the office in 2 weeks with 2 x-rays left shoulder AP and lateral.

## 2023-11-14 NOTE — Brief Op Note (Signed)
 11/14/2023  10:11 AM  PATIENT:  Garrett Hogan  78 y.o. male  PRE-OPERATIVE DIAGNOSIS:  Left shoulder rotator cuff arthropathy  POST-OPERATIVE DIAGNOSIS:  Left shoulder rotator cuff arthropathy  PROCEDURE:  Procedure(s): ARTHROPLASTY, SHOULDER, TOTAL, REVERSE (Left)  SURGEON:  Surgeons and Role:    * Janeth Medicus, MD - Primary  PHYSICIAN ASSISTANT:  Karyl Paget, PA-C  ANESTHESIA:   regional and general  EBL:  75 mL   BLOOD ADMINISTERED:none  DRAINS: none   LOCAL MEDICATIONS USED:  NONE  SPECIMEN:  No Specimen  DISPOSITION OF SPECIMEN:  N/A  COUNTS:  YES  TOURNIQUET:  * No tourniquets in log *  DICTATION: .Note written in EPIC  PLAN OF CARE: Discharge to home after PACU  PATIENT DISPOSITION:  PACU - hemodynamically stable.   Delay start of Pharmacological VTE agent (>24hrs) due to surgical blood loss or risk of bleeding: not applicable

## 2023-11-14 NOTE — Anesthesia Procedure Notes (Signed)
 Procedure Name: Intubation Date/Time: 11/14/2023 8:55 AM  Performed by: Darlena Ego, CRNAPre-anesthesia Checklist: Patient identified, Emergency Drugs available, Suction available and Patient being monitored Patient Re-evaluated:Patient Re-evaluated prior to induction Oxygen Delivery Method: Circle System Utilized Preoxygenation: Pre-oxygenation with 100% oxygen Induction Type: IV induction Ventilation: Mask ventilation without difficulty Tube type: Oral Number of attempts: 1 Airway Equipment and Method: Stylet and Oral airway Placement Confirmation: ETT inserted through vocal cords under direct vision, positive ETCO2 and breath sounds checked- equal and bilateral Secured at: 23 cm Tube secured with: Tape Dental Injury: Teeth and Oropharynx as per pre-operative assessment

## 2023-11-14 NOTE — Discharge Instructions (Addendum)
 Orthopedic surgery discharge instructions:  -Maintain postoperative bandage until follow-up appointment.  This is waterproof, and you may begin showering on postoperative day #3.  Do not submerge underwater.  Maintain that bandage until your follow-up appointment in 2 weeks.  -No lifting over 2 pounds with operateive arm.  You may use the arm immediately for activities of daily living such as bathing, washing your face and brushing your teeth, eating, and getting dressed.  Otherwise maintain your sling when you are out of the house and sleeping.  -Apply ice liberally to the shoulder throughout the day.  For mild to moderate pain use Tylenol and Advil as needed around-the-clock.  For breakthrough pain use oxycodone as necessary.  -You will return to see Dr. Aundria Rud in the office in 2 weeks for routine postoperative check with x-rays.

## 2023-11-14 NOTE — Transfer of Care (Signed)
 Immediate Anesthesia Transfer of Care Note  Patient: Garrett Hogan  Procedure(s) Performed: ARTHROPLASTY, SHOULDER, TOTAL, REVERSE (Left: Shoulder)  Patient Location: PACU  Anesthesia Type:General  Level of Consciousness: sedated and responds to stimulation  Airway & Oxygen Therapy: Patient Spontanous Breathing and Patient connected to face mask oxygen  Post-op Assessment: Report given to RN and Post -op Vital signs reviewed and stable  Post vital signs: Reviewed and stable  Last Vitals:  Vitals Value Taken Time  BP 147/68 11/14/23 10:35  Temp    Pulse 59 11/14/23 10:36  Resp 13 11/14/23 10:36  SpO2 100 % 11/14/23 10:36  Vitals shown include unfiled device data.  Last Pain:  Vitals:   11/14/23 0829  TempSrc:   PainSc: 0-No pain         Complications: No notable events documented.

## 2023-11-14 NOTE — Anesthesia Postprocedure Evaluation (Signed)
 Anesthesia Post Note  Patient: Garrett Hogan  Procedure(s) Performed: ARTHROPLASTY, SHOULDER, TOTAL, REVERSE (Left: Shoulder)     Patient location during evaluation: PACU Anesthesia Type: General Level of consciousness: awake and alert and oriented Pain management: pain level controlled Vital Signs Assessment: post-procedure vital signs reviewed and stable Respiratory status: spontaneous breathing, nonlabored ventilation and respiratory function stable Cardiovascular status: blood pressure returned to baseline and stable Postop Assessment: no apparent nausea or vomiting Anesthetic complications: no   No notable events documented.  Last Vitals:  Vitals:   11/14/23 1051 11/14/23 1100  BP:  136/67  Pulse: 74 67  Resp: 18 18  Temp:    SpO2: 100% 99%    Last Pain:  Vitals:   11/14/23 1100  TempSrc:   PainSc: 0-No pain                 Tyreke Kaeser A.

## 2023-11-14 NOTE — H&P (Addendum)
 ORTHOPAEDIC H and P  REQUESTING PHYSICIAN: Janeth Medicus, MD  PCP:  Benn Brash, PA-C  Chief Complaint: Left shoulder OA  HPI: Garrett Hogan is a 78 y.o. male who complains of left shoulder pain and weakness.  Here today for left reverse shoulder replacement.  No new complaints.  Past Medical History:  Diagnosis Date   Arthritis    BPH (benign prostatic hyperplasia)    CAD (coronary artery disease)    CKD (chronic kidney disease), stage III (HCC)    Coronary artery stenosis    GERD (gastroesophageal reflux disease)    Headache    Hepatitis    hx of Hep C - 1970s    Hypercholesteremia    Hypertension    LVH (left ventricular hypertrophy)    Murmur, cardiac    Past Surgical History:  Procedure Laterality Date   CARDIOVASCULAR STRESS TEST  2014   PARTIAL KNEE ARTHROPLASTY Right 10/10/2014   Procedure: RIGHT UNI-KNEE ARTHROPLASTY MEDIALLY;  Surgeon: Claiborne Crew, MD;  Location: [facility];  Service: Orthopedics;  Laterality: Right;   PARTIAL KNEE ARTHROPLASTY Left 07/23/2016   Procedure: LEFT MEDIALLY UNICOMPARTMENTAL KNEE;  Surgeon: Claiborne Crew, MD;  Location: [facility];  Service: Orthopedics;  Laterality: Left;   TONSILLECTOMY     VARICOSE VEIN SURGERY     left leg    Social History   Socioeconomic History   Marital status: Married    Spouse name: Delma Fern   Number of children: 2   Years of education: 14   Highest education level: Not on file  Occupational History   Not on file  Tobacco Use   Smoking status: Former    Current packs/day: 0.00    Average packs/day: 0.5 packs/day for 30.0 years (15.0 ttl pk-yrs)    Types: Cigarettes    Start date: 22    Quit date: 79    Years since quitting: 37.4   Smokeless tobacco: Never  Vaping Use   Vaping status: Never Used  Substance and Sexual Activity   Alcohol use: Not Currently    Comment: drinks wine once per month    Drug use: No   Sexual activity: Not on file  Other Topics Concern   Not on  file  Social History Narrative   Lives with wife in a 2 story home.  Has 2 children.     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    Right Handed   Social Drivers of Health   Financial Resource Strain: Not on file  Food Insecurity: Not on file  Transportation Needs: Not on file  Physical Activity: Not on file  Stress: Not on file  Social Connections: Not on file   Family History  Problem Relation Age of Onset   Heart disease Mother        Died at 36   Hypertension Mother    Heart attack Father        Died in 50s   Cancer Father    Lung cancer Sister    Healthy Brother    No Known Allergies Prior to Admission medications   Medication Sig Start Date End Date Taking? Authorizing Provider  amLODipine (NORVASC) 10 MG tablet Take 10 mg by mouth daily. 05/22/21  Yes [provider]  HYDROcodone -acetaminophen  (NORCO/VICODIN) 5-325 MG tablet Take 1 tablet by mouth every 6 (six) hours as needed for severe pain (pain score 7-10). 10/07/23  Yes [provider]  linaclotide (  LINZESS) 72 MCG capsule Take 72 mcg by mouth daily as needed (constipation).   Yes [provider]  losartan (COZAAR) 25 MG tablet Take 25 mg by mouth daily. 08/06/22  Yes [provider]  MYRBETRIQ 50 MG TB24 tablet Take 50 mg by mouth daily. 03/26/21  Yes [provider]  pantoprazole (PROTONIX) 40 MG tablet Take 40 mg by mouth daily. 04/16/21  Yes [provider]  polyethylene glycol (MIRALAX  / GLYCOLAX ) 17 g packet Take 17 g by mouth daily as needed for moderate constipation.   Yes [provider]  traMADol (ULTRAM) 50 MG tablet Take 100 mg by mouth at bedtime as needed for severe pain (pain score 7-10).   Yes [provider]   No results found.  Positive ROS: All other systems have been reviewed and were otherwise negative with the exception of those mentioned in the HPI and as above.  Physical  Exam: General: Alert, no acute distress Cardiovascular: No pedal edema Respiratory: No cyanosis, no use of accessory musculature GI: No organomegaly, abdomen is soft and non-tender Skin: No lesions in the area of chief complaint Neurologic: Sensation intact distally Psychiatric: Patient is competent for consent with normal mood and affect Lymphatic: No axillary or cervical lymphadenopathy  MUSCULOSKELETAL: LUE-  wwp, nvi  Assessment: Left shoulder OA  Plan: To OR today for reverse TSA Left shoulder  - The risks, benefits, and alternatives were discussed with the patient. There are risks associated with the surgery including, but not limited to, problems with anesthesia (death), infection, differences in leg length/angulation/rotation, fracture of bones, loosening or failure of implants, malunion, nonunion, hematoma (blood accumulation) which may require surgical drainage, blood clots, pulmonary embolism, nerve injury (foot drop), and blood vessel injury. The patient understands these risks and elects to proceed.  -dc home post op from [employer], MD Cell [phone]    11/14/2023 6:25 AM

## 2023-11-14 NOTE — Anesthesia Procedure Notes (Signed)
 Anesthesia Regional Block: Interscalene brachial plexus block   Pre-Anesthetic Checklist: , timeout performed,  Correct Patient, Correct Site, Correct Laterality,  Correct Procedure, Correct Position, site marked,  Risks and benefits discussed,  Surgical consent,  Pre-op evaluation,  At surgeon's request and post-op pain management  Laterality: Left  Prep: chloraprep       Needles:  Injection technique: Single-shot  Needle Type: Echogenic Stimulator Needle     Needle Length: 10cm  Needle Gauge: 21   Needle insertion depth: 6 cm   Additional Needles:   Procedures:,,,, ultrasound used (permanent image in chart),,   Motor weakness within 5 minutes.  Narrative:  Start time: 11/14/2023 8:21 AM End time: 11/14/2023 8:26 AM Injection made incrementally with aspirations every 5 mL.  Performed by: Personally  Anesthesiologist: Tura Gaines, MD  Additional Notes: Timeout performed. Patient sedated. Relevant anatomy ID'd using US . Incremental 2-5ml injection of LA with frequent aspiration. Patient tolerated procedure well.

## 2023-11-17 ENCOUNTER — Encounter (HOSPITAL_COMMUNITY): Payer: Self-pay | Admitting: Orthopedic Surgery
